# Patient Record
Sex: Female | Born: 1990 | Race: White | Hispanic: No | Marital: Married | Smoking: Never smoker
Health system: Southern US, Community
[De-identification: ages and names within clinical notes are randomized; demographics above are authoritative.]

---

## 2014-12-01 ENCOUNTER — Encounter: Payer: Self-pay | Admitting: Obstetrics & Gynecology

## 2014-12-01 ENCOUNTER — Ambulatory Visit (INDEPENDENT_AMBULATORY_CARE_PROVIDER_SITE_OTHER): Payer: 59 | Admitting: Obstetrics & Gynecology

## 2014-12-01 VITALS — BP 123/79 | HR 85 | Resp 16 | Ht 62.0 in | Wt 150.0 lb

## 2014-12-01 DIAGNOSIS — Z01419 Encounter for gynecological examination (general) (routine) without abnormal findings: Secondary | ICD-10-CM

## 2014-12-01 DIAGNOSIS — Z124 Encounter for screening for malignant neoplasm of cervix: Secondary | ICD-10-CM

## 2014-12-01 DIAGNOSIS — Z113 Encounter for screening for infections with a predominantly sexual mode of transmission: Secondary | ICD-10-CM

## 2014-12-01 DIAGNOSIS — Z3041 Encounter for surveillance of contraceptive pills: Secondary | ICD-10-CM

## 2014-12-01 DIAGNOSIS — Z0000 Encounter for general adult medical examination without abnormal findings: Secondary | ICD-10-CM

## 2014-12-01 MED ORDER — LEVONORGEST-ETH ESTRAD 91-DAY 0.15-0.03 &0.01 MG PO TABS: 1.0000 | ORAL_TABLET | Freq: Every day | ORAL | Status: DC

## 2014-12-01 NOTE — Progress Notes (Signed)
Subjective:    Amber Mack is a 24 y.o.MW G0  female who presents for an annual exam. The patient has no complaints today. She changed from her parent's insurance to her own and her generic Rosina LowensteinByaz is now too expensive. She would like a different one. Without the OCPs, she has heavy, painful periods. The patient is sexually active. GYN screening history: last pap: was normal. The patient wears seatbelts: yes. The patient participates in regular exercise: yes. Has the patient ever been transfused or tattooed?: yes. The patient reports that there is not domestic violence in her life.   Menstrual History: OB History    Gravida Para Term Preterm AB TAB SAB Ectopic Multiple Living   0 0 0 0 0 0 0 0 0 0       Menarche age: [age]  Patient's last menstrual period was 11/19/2014.    The following portions of the patient's history were reviewed and updated as appropriate: allergies, current medications, past family history, past medical history, past social history, past surgical history and problem list.  Review of Systems A comprehensive review of systems was negative.  She declines a flu vaccine. 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  Objective:    BP 123/79 mmHg  Pulse 85  Resp 16  Ht 5\' 2"  (1.575 m)  Wt 150 lb (68.04 kg)  BMI 27.43 kg/m2  LMP 11/19/2014  General Appearance:    Alert, cooperative, no distress, appears stated age  Head:    Normocephalic, without obvious abnormality, atraumatic  Eyes:    PERRL, conjunctiva/corneas clear, EOM's intact, fundi    benign, both eyes  Ears:    Normal TM's and external ear canals, both ears  Nose:   Nares normal, septum midline, mucosa normal, no drainage    or sinus tenderness  Throat:   Lips, mucosa, and tongue normal; teeth and gums normal  Neck:   Supple, symmetrical, trachea midline, no adenopathy;    thyroid:  no enlargement/tenderness/nodules; no carotid   bruit or JVD  Back:     Symmetric, no curvature, ROM normal, no CVA  tenderness  Lungs:     Clear to auscultation bilaterally, respirations unlabored  Chest Wall:    No tenderness or deformity   Heart:    Regular rate and rhythm, S1 and S2 normal, no murmur, rub   or gallop  Breast Exam:    No tenderness, masses, or nipple abnormality  Abdomen:     Soft, non-tender, bowel sounds active all four quadrants,    no masses, no organomegaly  Genitalia:    Normal female without lesion, discharge or tenderness     Extremities:   Extremities normal, atraumatic, no cyanosis or edema  Pulses:   2+ and symmetric all extremities  Skin:   Skin color, texture, turgor normal, no rashes or lesions  Lymph nodes:   Cervical, supraclavicular, and axillary nodes normal  Neurologic:   CNII-XII intact, normal strength, sensation and reflexes    throughout  .    Assessment:    Healthy female exam.    Plan:     Breast self exam technique reviewed and patient encouraged to perform self-exam monthly. Chlamydia specimen. GC specimen. Thin prep Pap smear.   Change to camrese (extended cycle, generic)

## 2014-12-05 LAB — CYTOLOGY - PAP

## 2015-01-16 ENCOUNTER — Encounter: Payer: Self-pay | Admitting: Emergency Medicine

## 2015-01-16 ENCOUNTER — Emergency Department (INDEPENDENT_AMBULATORY_CARE_PROVIDER_SITE_OTHER)
Admission: EM | Admit: 2015-01-16 | Discharge: 2015-01-16 | Disposition: A | Discharge status: Home / Self Care | Payer: 59 | Source: Home / Self Care | Attending: Family Medicine | Admitting: Family Medicine

## 2015-01-16 DIAGNOSIS — J029 Acute pharyngitis, unspecified: Secondary | ICD-10-CM

## 2015-01-16 LAB — POCT RAPID STREP A (OFFICE): Rapid Strep A Screen: NEGATIVE

## 2015-01-16 MED ORDER — LIDOCAINE VISCOUS 2 % MT SOLN: 15.0000 mL | Freq: Three times a day (TID) | OROMUCOSAL | Status: DC

## 2015-01-16 NOTE — ED Notes (Signed)
Sore throat x 5 days

## 2015-01-16 NOTE — ED Provider Notes (Signed)
CSN: [id number]     Arrival date & time 01/16/15  1513 History   First MD Initiated Contact with Patient 01/16/15 [phone]     Chief Complaint  Patient presents with  . Sore Throat      HPI Comments: Four days ago patient developed a sore throat, fatigue, myalgias, and a headache that has resolved.  The sore throat has persisted, but she has developed no other symptoms.  The history is provided by the patient.    History reviewed. No pertinent past medical history. History reviewed. No pertinent past surgical history. Family History  Problem Relation Age of Onset  . Cancer Maternal Aunt     brain  . Cancer Maternal Grandmother     great gm  stomach   History  Substance Use Topics  . Smoking status: Never Smoker   . Smokeless tobacco: Never Used  . Alcohol Use: 0.0 oz/week    0 Standard drinks or equivalent per week     Comment: receational use   OB History    Gravida Para Term Preterm AB TAB SAB Ectopic Multiple Living       Review of Systems + sore throat No cough No pleuritic pain No wheezing No nasal congestion ? post-nasal drainage No sinus pain/pressure No itchy/red eyes No earache No hemoptysis No SOB No fever/chills No nausea No vomiting No abdominal pain No diarrhea No urinary symptoms No skin rash + fatigue + myalgias + headache Used OTC meds without relief  Allergies  Review of patient's allergies indicates not on file.  Home Medications   Prior to Admission medications   Medication Sig Start Date End Date Taking? Authorizing Provider  Drospirenone-Ethinyl Estradiol-Levomefol (BEYAZ) 3-0.02-0.451 MG tablet Take 1 tablet by mouth daily.    Historical Provider, MD  Levonorgestrel-Ethinyl Estradiol (AMETHIA,CAMRESE) 0.15-0.03 &0.01 MG tablet Take 1 tablet by mouth daily. 12/01/14   Allie Bossier, MD  lidocaine (XYLOCAINE) 2 % solution Use as directed 15 mLs in the mouth or throat 4 (four) times daily -  before meals and at bedtime.  Gargle, then expectorate 01/16/15   Lattie Haw, MD  Multiple Vitamin (MULTIVITAMIN) tablet Take 1 tablet by mouth daily.    Historical Provider, MD   BP 112/75 mmHg  Pulse 81  Temp(Src) 98.3 F (36.8 C) (Oral)  Ht  (1.575 m)  Wt 154 lb (69.854 kg)  BMI 28.16 kg/m2  SpO2 100%  LMP 12/16/2014 Physical Exam Nursing notes and Vital Signs reviewed. Appearance:  Patient appears stated age, and in no acute distress Eyes:  Pupils are equal, round, and reactive to light and accomodation.  Extraocular movement is intact.  Conjunctivae are not inflamed  Ears:  Canals normal.  Tympanic membranes normal.  Nose:  Mildly congested turbinates.  No sinus tenderness.    Pharynx:  Uvula is slightly edematous, and there is mild erythema in posterior pharynx. Neck:  Supple.  The anterior nodes are tender but not enlarged. The posterior nodes are enlarged and tender bilaterally  Lungs:  Clear to auscultation.  Breath sounds are equal.  Heart:  Regular rate and rhythm without murmurs, rubs, or gallops.  Abdomen:  Nontender without masses or hepatosplenomegaly.  Bowel sounds are present.  No CVA or flank tenderness.  Extremities:  No edema.  No calf tenderness Skin:  No rash present.    ED Course  Procedures  None    Labs Reviewed  STREP A DNA PROBE  POCT RAPID STREP A (OFFICE) negative        MDM   1. Acute pharyngitis, unspecified pharyngitis type; suspect early viral URI    Throat culture pending.  Treat symptomatically for now. May take Ibuprofen 200mg , 4 tabs every 8 hours with food for sore throat.  Try warm salt water gargles.  If cold-like symptoms develop, try the following: Take plain guaifenesin (1200mg  extended release tabs such as Mucinex) twice daily, with plenty of water, for cough and congestion.  May add Pseudoephedrine (30mg , one or two every 4 to 6 hours) for sinus congestion.  Get adequate rest.   May use Afrin nasal spray (or generic oxymetazoline) twice daily for  about 5 days.  Also recommend using saline nasal spray several times daily and saline nasal irrigation (AYR is a common brand).    May take Delsym Cough Suppressant at bedtime for nighttime cough.  Stop all antihistamines for now, and other non-prescription cough/cold preparations.   Follow-up with family doctor if not improving about 7 to10 days.     Lattie HawStephen A Danen Lapaglia, MD 01/18/15 [phone]

## 2015-01-16 NOTE — Discharge Instructions (Signed)
May take Ibuprofen 200mg , 4 tabs every 8 hours with food for sore throat.  Try warm salt water gargles.  If cold-like symptoms develop, try the following: Take plain guaifenesin (1200mg  extended release tabs such as Mucinex) twice daily, with plenty of water, for cough and congestion.  May add Pseudoephedrine (30mg , one or two every 4 to 6 hours) for sinus congestion.  Get adequate rest.   May use Afrin nasal spray (or generic oxymetazoline) twice daily for about 5 days.  Also recommend using saline nasal spray several times daily and saline nasal irrigation (AYR is a common brand).    May take Delsym Cough Suppressant at bedtime for nighttime cough.  Stop all antihistamines for now, and other non-prescription cough/cold preparations.   Follow-up with family doctor if not improving about 7 to10 days.    Salt Water Gargle This solution will help make your mouth and throat feel better. HOME CARE INSTRUCTIONS   Mix 1 teaspoon of salt in 8 ounces of warm water.  Gargle with this solution as much or often as you need or as directed. Swish and gargle gently if you have any sores or wounds in your mouth.  Do not swallow this mixture. Document Released: 07/04/2004 Document Revised: 12/23/2011 Document Reviewed: 11/25/2008 [facility] Patient Information 2015 [employer] [place]. This information is not intended to replace advice given to you by your health care provider. Make sure you discuss any questions you have with your health care provider.

## 2015-01-17 LAB — STREP A DNA PROBE: GASP: NEGATIVE

## 2015-01-19 ENCOUNTER — Telehealth: Payer: Self-pay | Admitting: *Deleted

## 2015-05-10 ENCOUNTER — Telehealth: Payer: Self-pay

## 2015-05-10 NOTE — Telephone Encounter (Signed)
Patient called and would like new form of birthcontrol.   Attempted to call patient back and get answering machine message that  "voicemailbox not set up". Armandina Stammer RN BSN

## 2015-05-15 ENCOUNTER — Telehealth: Payer: Self-pay | Admitting: *Deleted

## 2015-05-15 DIAGNOSIS — Z760 Encounter for issue of repeat prescription: Secondary | ICD-10-CM

## 2015-05-15 MED ORDER — NORETHIN ACE-ETH ESTRAD-FE 1-20 MG-MCG PO TABS: 1.0000 | ORAL_TABLET | Freq: Every day | ORAL | Status: DC

## 2015-05-15 NOTE — Telephone Encounter (Signed)
Pt req to change OCP due to DUB on current pills. Sent new Rx to pt preferred pharmacy

## 2016-06-10 ENCOUNTER — Ambulatory Visit: Payer: Self-pay | Admitting: Osteopathic Medicine

## 2016-07-23 ENCOUNTER — Ambulatory Visit (INDEPENDENT_AMBULATORY_CARE_PROVIDER_SITE_OTHER): Payer: 59 | Admitting: Osteopathic Medicine

## 2016-07-23 ENCOUNTER — Encounter: Payer: Self-pay | Admitting: Osteopathic Medicine

## 2016-07-23 VITALS — BP 124/76 | HR 71 | Ht 61.0 in | Wt 140.0 lb

## 2016-07-23 DIAGNOSIS — Z3041 Encounter for surveillance of contraceptive pills: Secondary | ICD-10-CM

## 2016-07-23 DIAGNOSIS — Z0000 Encounter for general adult medical examination without abnormal findings: Secondary | ICD-10-CM

## 2016-07-23 MED ORDER — LEVONORGESTREL-ETHINYL ESTRAD 0.1-20 MG-MCG PO TABS: 1.0000 | ORAL_TABLET | Freq: Every day | ORAL | 3 refills | Status: DC

## 2016-07-23 NOTE — Progress Notes (Signed)
HPI: Amber Mack is a 25 y.o. female  who presents to [facility] today, 07/23/16,  for chief complaint of:  Chief Complaint  Patient presents with  . Establish Care    ANNUAL WELLNESS EXAM    New patient here to establish care, no complaints today. See below for review of preventive care. Patient does request alternative birth control as the one she has been taking has had coverage change through the insurance and is significantly expensive. Discussion about ideal body weight, diet/exercise plan as well.  Past medical, surgical, social and family history reviewed: History reviewed. No pertinent past medical history. History reviewed. No pertinent surgical history. Social History  Substance Use Topics  . Smoking status: Never Smoker  . Smokeless tobacco: Never Used  . Alcohol use Not on file   Family History  Problem Relation Age of Onset  . Hyperlipidemia Father   . Cancer Maternal Aunt     BRAIN     Current medication list and allergy/intolerance information reviewed:   Current Outpatient Prescriptions  Medication Sig Dispense Refill  . YASMIN 28 3-0.03 MG tablet      No current facility-administered medications for this visit.    No Known Allergies    Review of Systems:  Constitutional:  No  fever, no chills, No recent illness, No unintentional weight changes. No significant fatigue.   HEENT: No  headache, no vision change, no hearing change, No sore throat, No  sinus pressure  Cardiac: No  chest pain, No  pressure, No palpitations, No  Orthopnea  Respiratory:  No  shortness of breath. No  Cough  Gastrointestinal: No  abdominal pain, No  nausea, No  vomiting,  No  blood in stool, No  diarrhea, No  constipation   Musculoskeletal: No new myalgia/arthralgia  Genitourinary: No  incontinence, No  abnormal genital bleeding, No abnormal genital discharge  Skin: No  Rash, No other wounds/concerning lesions  Hem/Onc: No  easy  bruising/bleeding, No  abnormal lymph node  Endocrine: No cold intolerance,  No heat intolerance. No polyuria/polydipsia/polyphagia   Neurologic: No  weakness, No  dizziness, No  slurred speech/focal weakness/facial droop  Psychiatric: No  concerns with depression, No  concerns with anxiety, No sleep problems, No mood problems  Exam:  BP 124/76   Pulse 71   Ht 5\' 1"  (1.549 m)   Wt 140 lb (63.5 kg)   BMI 26.45 kg/m   Constitutional: VS see above. General Appearance: alert, well-developed, well-nourished, NAD  Eyes: Normal lids and conjunctive, non-icteric sclera  Ears, Nose, Mouth, Throat: MMM, Normal external inspection ears/nares/mouth/lips/gums. TM normal bilaterally. Pharynx/tonsils no erythema, no exudate. Nasal mucosa normal.   Neck: No masses, trachea midline. No thyroid enlargement.   Respiratory: Normal respiratory effort. no wheeze, no rhonchi, no rales  Cardiovascular: S1/S2 normal, no murmur, no rub/gallop auscultated. RRR. No lower extremity edema.   Gastrointestinal: Nontender, no masses. No hepatomegaly, no splenomegaly. No hernia appreciated. Bowel sounds normal. Rectal exam deferred.   Musculoskeletal: Gait normal. No clubbing/cyanosis of digits.   Neurological: Normal balance/coordination. No tremor.   Skin: warm, dry, intact. No rash/ulcer.   Psychiatric: Normal judgment/insight. Normal mood and affect. Oriented x3.    ASSESSMENT/PLAN:    Annual physical exam - Plan: CBC with Differential/Platelet, COMPLETE METABOLIC PANEL WITH GFR, Lipid panel  Encounter for surveillance of contraceptive pills - Plan: levonorgestrel-ethinyl estradiol (AVIANE,ALESSE,LESSINA) 0.1-20 MG-MCG tablet    FEMALE PREVENTIVE CARE  ANNUAL SCREENING/COUNSELING Tobacco - noNever  Alcohol -  social drinker Diet/Exercise - HEALTHY HABITS DISCUSSED TO DECREASE CV RISK Depression - PQH2 Negative Domestic violence concerns - no HTN SCREENING - SEE VITALS Vaccination status - SEE  BELOW  SEXUAL HEALTH Sexually active in the past year - yes With - Yes with female. STI - The patient denies history of sexually transmitted disease. STI testing today? - no  INFECTIOUS DISEASE SCREENING HIV - all adults 15-65 - does not need GC/CT - sexually active - does not need HepC - DOB [birth year] - does not need TB - does not need  DISEASE SCREENING Lipid - needs DM2 - needs Osteoporosis - does not need  CANCER SCREENING Cervical - does not need Breast - does not need Lung - does not need Colon - does not need  ADULT VACCINATION Influenza - was not indicated Td - already has HPV - was not indicated Zoster - was not indicated Pneumonia - was not indicated  OTHER Fall - exercise and Vit D age 73+ - does not need Consider ASA - age 25-59 - does not need   Visit summary with medication list and pertinent instructions was printed for patient to review. All questions at time of visit were answered - patient instructed to contact office with any additional concerns. ER/RTC precautions were reviewed with the patient. Follow-up plan: Return in about 1 year (around 07/23/2017) [facility] or sooner if needed.

## 2016-07-26 ENCOUNTER — Encounter: Payer: Self-pay | Admitting: Emergency Medicine

## 2016-12-06 DIAGNOSIS — Z01419 Encounter for gynecological examination (general) (routine) without abnormal findings: Secondary | ICD-10-CM | POA: Diagnosis not present

## 2017-02-08 DIAGNOSIS — S61501A Unspecified open wound of right wrist, initial encounter: Secondary | ICD-10-CM | POA: Diagnosis not present

## 2017-02-08 DIAGNOSIS — W5501XA Bitten by cat, initial encounter: Secondary | ICD-10-CM | POA: Diagnosis not present

## 2017-09-18 ENCOUNTER — Encounter: Payer: Self-pay | Admitting: Osteopathic Medicine

## 2017-09-18 ENCOUNTER — Ambulatory Visit (INDEPENDENT_AMBULATORY_CARE_PROVIDER_SITE_OTHER): Payer: 59 | Admitting: Osteopathic Medicine

## 2017-09-18 VITALS — BP 109/74 | HR 68 | Ht 62.0 in | Wt 152.0 lb

## 2017-09-18 DIAGNOSIS — Z0000 Encounter for general adult medical examination without abnormal findings: Secondary | ICD-10-CM | POA: Diagnosis not present

## 2017-09-18 DIAGNOSIS — Z8349 Family history of other endocrine, nutritional and metabolic diseases: Secondary | ICD-10-CM | POA: Diagnosis not present

## 2017-09-18 DIAGNOSIS — Z3041 Encounter for surveillance of contraceptive pills: Secondary | ICD-10-CM | POA: Diagnosis not present

## 2017-09-18 MED ORDER — LEVONORGESTREL-ETHINYL ESTRAD 0.1-20 MG-MCG PO TABS: 1.0000 | ORAL_TABLET | Freq: Every day | ORAL | 3 refills | Status: DC

## 2017-09-18 NOTE — Progress Notes (Signed)
HPI: Amber Mack is a 26 y.o. female  who [name], [facility],  for chief complaint of:  Chief Complaint  Patient presents with  . Annual Exam    See below for review of preventive care.    Patient does request alternative birth control as the one she has been taking has had coverage change through the insurance and is significantly expensive. Discussion about ideal body weight, diet/exercise plan as well.  Past medical, surgical, social and family history reviewed:  No past medical history on file.   No past surgical history on file.   Social History   Tobacco Use  . Smoking status: Never Smoker  . Smokeless tobacco: Never Used  Substance Use Topics  . Alcohol use: Yes    Alcohol/week: 0.0 oz    Comment: receational use   Family History  Problem Relation Age of Onset  . Hyperlipidemia Father   . Cancer Maternal Aunt        BRAIN/brain  . Cancer Maternal Grandmother        great gm  stomach  . Hypothyroidism Sister   . Thyroid nodules Sister      Current medication list and allergy/intolerance information reviewed:   Current Outpatient Medications  Medication Sig Dispense Refill  . levonorgestrel-ethinyl estradiol (AVIANE,ALESSE,LESSINA) 0.1-20 MG-MCG tablet Take 1 tablet by mouth daily. 3 Package 3  . Multiple Vitamin (MULTIVITAMIN) tablet Take 1 tablet by mouth daily.     No current facility-administered medications for this visit.    No Known Allergies    Review of Systems:  Constitutional:  No  fever, no chills, No recent illness, No unintentional weight changes. No significant fatigue.   HEENT: No  headache, no vision change, no hearing change, No sore throat, No  sinus pressure  Cardiac: No  chest pain, No  pressure, No palpitations, No  Orthopnea  Respiratory:  No  shortness of breath. No  Cough  Gastrointestinal: No  abdominal pain, No  nausea, No  vomiting,  No  blood in stool, No   diarrhea, No  constipation   Musculoskeletal: No new myalgia/arthralgia  Genitourinary: No  incontinence, No  abnormal genital bleeding, No abnormal genital discharge  Skin: No  Rash, No other wounds/concerning lesions  Hem/Onc: No  easy bruising/bleeding, No  abnormal lymph node  Endocrine: No cold intolerance,  No heat intolerance. No polyuria/polydipsia/polyphagia   Neurologic: No  weakness, No  dizziness, No  slurred speech/focal weakness/facial droop  Psychiatric: No  concerns with depression, No  concerns with anxiety, No sleep problems, No mood problems  Exam:  BP 109/74   Pulse 68   Ht 5\' 2"  (1.575 m)   Wt 152 lb (68.9 kg)   SpO2 100%   BMI 27.80 kg/m   Constitutional: VS see above. General Appearance: alert, well-developed, well-nourished, NAD  Eyes: Normal lids and conjunctive, non-icteric sclera  Ears, Nose, Mouth, Throat: MMM, Normal external inspection ears/nares/mouth/lips/gums. TM normal bilaterally. Pharynx/tonsils no erythema, no exudate. Nasal mucosa normal.   Neck: No masses, trachea midline. No thyroid enlargement.   Respiratory: Normal respiratory effort. no wheeze, no rhonchi, no rales  Cardiovascular: S1/S2 normal, no murmur, no rub/gallop auscultated. RRR. No lower extremity edema.   Gastrointestinal: Nontender, no masses. No hepatomegaly, no splenomegaly. No hernia appreciated. Bowel sounds normal. Rectal exam deferred.   Musculoskeletal: Gait normal. No clubbing/cyanosis of digits.   Neurological: Normal balance/coordination. No tremor.   Skin: warm, dry, intact. No rash/ulcer.  Psychiatric: Normal judgment/insight. Normal mood and affect. Oriented x3.    ASSESSMENT/PLAN:    Annual physical exam - Plan: COMPLETE METABOLIC PANEL WITH GFR, CBC, Lipid panel  Encounter for surveillance of contraceptive pills - Plan: levonorgestrel-ethinyl estradiol (AVIANE,ALESSE,LESSINA) 0.1-20 MG-MCG tablet  Family history of thyroid disease in sister -  Plan: TSH    FEMALE PREVENTIVE CARE  ANNUAL SCREENING/COUNSELING Tobacco - noNever  Alcohol - social drinker Diet/Exercise - HEALTHY HABITS DISCUSSED TO DECREASE CV RISK Depression - PQH2 Negative Domestic violence concerns - no HTN SCREENING - SEE VITALS Vaccination status - SEE BELOW  SEXUAL HEALTH Sexually active in the past year - yes With - Yes with female. Married, husband is [name]  STI - The patient denies history of sexually transmitted disease. STI testing needed/desired today? - no  INFECTIOUS DISEASE SCREENING HIV - all adults 15-65 - does not need GC/CT - sexually active - does not need HepC - DOB [birth year] - does not need TB - does not need  DISEASE SCREENING Lipid - needs DM2 - needs Osteoporosis - does not need  CANCER SCREENING Cervical - does not need - last done and normal 2016 Breast - does not need - no FH Lung - does not need Colon - does not need - no FH  ADULT VACCINATION Influenza - annual vaccine recommended Td - already has 2011 Zoster - was not indicated Pneumonia - was not indicated Shingles - was not indicated      Visit summary with medication list and pertinent instructions was printed for patient to review. All questions at time of visit were answered - patient instructed to contact office with any additional concerns. ER/RTC precautions were reviewed with the patient. Follow-up plan: Return in about 1 year (around 09/18/2018) for ANNUAL PHYSICAL - sooner if needed .

## 2018-01-02 DIAGNOSIS — Z01419 Encounter for gynecological examination (general) (routine) without abnormal findings: Secondary | ICD-10-CM | POA: Diagnosis not present

## 2018-09-22 ENCOUNTER — Encounter: Payer: Self-pay | Admitting: Osteopathic Medicine

## 2018-09-22 ENCOUNTER — Ambulatory Visit (INDEPENDENT_AMBULATORY_CARE_PROVIDER_SITE_OTHER): Payer: 59 | Admitting: Osteopathic Medicine

## 2018-09-22 VITALS — BP 115/65 | HR 55 | Temp 98.0°F | Wt 158.3 lb

## 2018-09-22 DIAGNOSIS — Z23 Encounter for immunization: Secondary | ICD-10-CM | POA: Diagnosis not present

## 2018-09-22 DIAGNOSIS — Z0000 Encounter for general adult medical examination without abnormal findings: Secondary | ICD-10-CM | POA: Diagnosis not present

## 2018-09-22 DIAGNOSIS — Z8349 Family history of other endocrine, nutritional and metabolic diseases: Secondary | ICD-10-CM

## 2018-09-22 MED ORDER — BEYAZ 3-0.02-0.451 MG PO TABS: 1.0000 | ORAL_TABLET | Freq: Every day | ORAL | 4 refills | Status: DC

## 2018-09-22 NOTE — Patient Instructions (Addendum)
General Preventive Care  Most recent routine screening lipids/other labs: ordered today.   Everyone should have blood pressure checked once per year. Yours looks good!    Tobacco: don't!   Alcohol: responsible moderation is ok for most adults - if you have concerns about your alcohol intake, please talk to me!   Exercise: as tolerated to reduce risk of cardiovascular disease and diabetes. Strength training will also prevent osteoporosis.   Mental health: if need for mental health care (medicines, counseling, other), or concerns about moods, please let me know!   Sexual health: if need for STD testing, or if concerns with libido/pain problems, please let me know! If you need to discuss your birth control options, please let me know!   Advanced Directive: Living Will and/or Healthcare Power of Attorney recommended for all adults, regardless of age or health.  Vaccines: please get your records to us!   Flu vaccine: recommended for almost everyone, every fall. Thanks for getting this today!   Shingles vaccine: Shingrix recommended after age 27.  Pneumonia vaccines: Prevnar and Pneumovax recommended after age 27  Tetanus booster: Tdap recommended every 10 years - will try to hunt down the urgent care records.   HPV vaccine: Gardasil recommended up to age 27 - all done! Renee RivalYay!  Cancer screenings   Colon cancer screening: recommended for everyone at age 750, sooner if needed based on family history  Breast cancer screening: mammogram recommended at age 27 every other year at least, and annually after age 27.   Cervical cancer screening: Pap every 1 to 5 years depending on age and other risk factors. Follow as directed with OBGYN.   Lung cancer screening: not needed  Infection screenings . HIV: recommended screening at least once age 27-65, more often as needed. . Gonorrhea/Chlamydia: screening as needed, though many insurances require testing for anyone on birth control  pills. . Hepatitis C: recommended for anyone born [id number] - not needed for you.  . TB: certain at-risk populations, or depending on work requirements and/or travel history Other . Bone Density Test: recommended for women at age 27

## 2018-09-22 NOTE — Progress Notes (Signed)
HPI: Amber Mack is a 27 y.o. female who  has no past medical history on file.  she presents to [facility] today, 09/22/18,  for chief complaint of: Annual check-up    Patient here for annual physical / wellness exam.  See preventive care reviewed as below.  Recent labs reviewed in detail with the patient.   Additional concerns today include:   Would like to get back on Beyaz eyac birth control once insurance changes in the new year. Has been on 5 or 6 different OCP which caused other side effects for her including mood and bleeding changes.      Past medical, surgical, social and family history reviewed:  Patient Active Problem List   Diagnosis Date Noted  . Family history of thyroid disease in sister 09/18/2017   History reviewed. No pertinent surgical history.  Social History   Tobacco Use  . Smoking status: Never Smoker  . Smokeless tobacco: Never Used  Substance Use Topics  . Alcohol use: Yes    Alcohol/week: 0.0 standard drinks    Comment: receational use    Family History  Problem Relation Age of Onset  . Hyperlipidemia Father   . Cancer Maternal Aunt        BRAIN/brain  . Cancer Maternal Grandmother        great gm  stomach  . Hypothyroidism Sister   . Thyroid nodules Sister      Current medication list and allergy/intolerance information reviewed:    Current Outpatient Medications  Medication Sig Dispense Refill  . levonorgestrel-ethinyl estradiol (AVIANE,ALESSE,LESSINA) 0.1-20 MG-MCG tablet Take 1 tablet by mouth daily. 3 Package 3  . Multiple Vitamin (MULTIVITAMIN) tablet Take 1 tablet by mouth daily.     No current facility-administered medications for this visit.     No Known Allergies   Immunization History  Administered Date(s) Administered  . Influenza,inj,Quad PF,6+ Mos 09/22/2018      Review of Systems:  Constitutional:  No  fever, no chills, No recent illness, No unintentional weight  changes. No significant fatigue.   HEENT: No  headache, no vision change, no hearing change, No sore throat, No  sinus pressure  Cardiac: No  chest pain, No  pressure, No palpitations  Respiratory:  No  shortness of breath. No  Cough  Gastrointestinal: No  abdominal pain, No  nausea, No  vomiting,  No  blood in stool, No  diarrhea, No  constipation   Musculoskeletal: No new myalgia/arthralgia  Skin: No  Rash, No other wounds/concerning lesions  Genitourinary: No  incontinence, No  abnormal genital bleeding, No abnormal genital discharge  Hem/Onc: No  easy bruising/bleeding, No  abnormal lymph node  Endocrine: No cold intolerance,  No heat intolerance. No polyuria/polydipsia/polyphagia   Neurologic: No  weakness, No  dizziness, No  slurred speech/focal weakness/facial droop  Psychiatric: No  concerns with depression, No  concerns with anxiety, No sleep problems, No mood problems  Exam:  BP 115/65 (BP Location: Left Arm, Patient Position: Sitting, Cuff Size: Normal)   Pulse (!) 55   Temp 98 F (36.7 C) (Oral)   Wt 158 lb 4.8 oz (71.8 kg)   BMI 28.95 kg/m   Constitutional: VS see above. General Appearance: alert, well-developed, well-nourished, NAD  Eyes: Normal lids and conjunctive, non-icteric sclera  Ears, Nose, Mouth, Throat: MMM, Normal external inspection ears/nares/mouth/lips/gums. TM normal bilaterally. Pharynx/tonsils no erythema, no exudate. Nasal mucosa normal.   Neck: No masses, trachea midline. No thyroid enlargement. No tenderness/mass  appreciated. No lymphadenopathy  Respiratory: Normal respiratory effort. no wheeze, no rhonchi, no rales  Cardiovascular: S1/S2 normal, no murmur, no rub/gallop auscultated. RRR. No lower extremity edema.   Gastrointestinal: Nontender, no masses. No hepatomegaly, no splenomegaly. No hernia appreciated. Bowel sounds normal. Rectal exam deferred.   Musculoskeletal: Gait normal. No clubbing/cyanosis of digits.   Neurological:  Normal balance/coordination. No tremor. No cranial nerve deficit on limited exam. Motor and sensation intact and symmetric. Cerebellar reflexes intact.   Skin: warm, dry, intact. No rash/ulcer. No concerning nevi or subq nodules on limited exam.    Psychiatric: Normal judgment/insight. Normal mood and affect. Oriented x3.          ASSESSMENT/PLAN: The primary encounter diagnosis was Annual physical exam. Diagnoses of Need for influenza vaccination and Family history of thyroid disease in sister were also pertinent to this visit.   Orders Placed This Encounter  Procedures  . Flu Vaccine QUAD 6+ mos PF IM (Fluarix Quad PF)  . CBC  . COMPLETE METABOLIC PANEL WITH GFR  . Lipid panel  . TSH    Meds ordered this encounter  Medications  . BEYAZ 3-0.02-0.451 MG tablet    Sig: Take 1 tablet by mouth daily.    Dispense:  3 Package    Refill:  4    Please forward prior authorization request if needed to Dr Mardelle Matte office fax [phone]    Patient Instructions  General Preventive Care  Most recent routine screening lipids/other labs: ordered today.   Everyone should have blood pressure checked once per year. Yours looks good!    Tobacco: don't!   Alcohol: responsible moderation is ok for most adults - if you have concerns about your alcohol intake, please talk to me!   Exercise: as tolerated to reduce risk of cardiovascular disease and diabetes. Strength training will also prevent osteoporosis.   Mental health: if need for mental health care (medicines, counseling, other), or concerns about moods, please let me know!   Sexual health: if need for STD testing, or if concerns with libido/pain problems, please let me know! If you need to discuss your birth control options, please let me know!   Advanced Directive: Living Will and/or Healthcare Power of Attorney recommended for all adults, regardless of age or health.  Vaccines: please get your records to [place]!   Flu vaccine:  recommended for almost everyone, every fall. Thanks for getting this today!   Shingles vaccine: Shingrix recommended after age 46.  Pneumonia vaccines: Prevnar and Pneumovax recommended after age 81  Tetanus booster: Tdap recommended every 10 years - will try to hunt down the urgent care records.   HPV vaccine: Gardasil recommended up to age 49 - all done! Renee Rival!  Cancer screenings   Colon cancer screening: recommended for everyone at age 43, sooner if needed based on family history  Breast cancer screening: mammogram recommended at age 60 every other year at least, and annually after age 30.   Cervical cancer screening: Pap every 1 to 5 years depending on age and other risk factors. Follow as directed with OBGYN.   Lung cancer screening: not needed  Infection screenings . HIV: recommended screening at least once age 68-65, more often as needed. . Gonorrhea/Chlamydia: screening as needed, though many insurances require testing for anyone on birth control pills. . Hepatitis C: recommended for anyone born [birth year]-1965 - not needed for you.  . TB: certain at-risk populations, or depending on work requirements and/or travel history Other . Bone Density Test: recommended  for women at age 27       Visit summary with medication list and pertinent instructions was printed for patient to review. All questions at time of visit were answered - patient instructed to contact office with any additional concerns or updates. ER/RTC precautions were reviewed with the patient.    Please note: voice recognition software was used to produce this document, and typos may escape review. Please contact Dr. Lyn HollingsheadAlexander for any needed clarifications.     Follow-up plan: Return in about 1 year (around 09/23/2019) for [employer] [facility], SOONER IF NEEDED .

## 2018-09-23 LAB — COMPLETE METABOLIC PANEL WITH GFR
AG Ratio: 1.2 (calc) (ref 1.0–2.5)
ALBUMIN MSPROF: 3.6 g/dL (ref 3.6–5.1)
ALT: 12 U/L (ref 6–29)
AST: 15 U/L (ref 10–30)
Alkaline phosphatase (APISO): 50 U/L (ref 33–115)
BUN: 14 mg/dL (ref 7–25)
CALCIUM: 9.1 mg/dL (ref 8.6–10.2)
CO2: 30 mmol/L (ref 20–32)
CREATININE: 0.78 mg/dL (ref 0.50–1.10)
Chloride: 104 mmol/L (ref 98–110)
GFR, Est African American: 121 mL/min/{1.73_m2} (ref >=60)
GFR, Est Non African American: 104 mL/min/{1.73_m2} (ref >=60)
GLOBULIN: 3 g/dL (ref 1.9–3.7)
GLUCOSE: 84 mg/dL (ref 65–99)
Potassium: 4 mmol/L (ref 3.5–5.3)
SODIUM: 140 mmol/L (ref 135–146)
Total Bilirubin: 0.4 mg/dL (ref 0.2–1.2)
Total Protein: 6.6 g/dL (ref 6.1–8.1)

## 2018-09-23 LAB — CBC
HCT: 41.6 % (ref 35.0–45.0)
HEMOGLOBIN: 14.7 g/dL (ref 11.7–15.5)
MCH: 31.7 pg (ref 27.0–33.0)
MCHC: 35.3 g/dL (ref 32.0–36.0)
MCV: 89.8 fL (ref 80.0–100.0)
MPV: 10.3 fL (ref 7.5–12.5)
Platelets: 277 10*3/uL (ref 140–400)
RBC: 4.63 10*6/uL (ref 3.80–5.10)
RDW: 12.9 % (ref 11.0–15.0)
WBC: 6.9 10*3/uL (ref 3.8–10.8)

## 2018-09-23 LAB — LIPID PANEL
CHOL/HDL RATIO: 3.1 (calc) (ref <5.0)
Cholesterol: 202 mg/dL — ABNORMAL HIGH (ref <200)
HDL: 66 mg/dL (ref >50)
LDL Cholesterol (Calc): 119 mg/dL (calc) — ABNORMAL HIGH
Non-HDL Cholesterol (Calc): 136 mg/dL (calc) — ABNORMAL HIGH (ref <130)
Triglycerides: 74 mg/dL (ref <150)

## 2018-09-23 LAB — TSH: TSH: 2.11 mIU/L

## 2019-09-23 ENCOUNTER — Encounter: Payer: Self-pay | Admitting: Osteopathic Medicine

## 2019-11-09 ENCOUNTER — Ambulatory Visit (INDEPENDENT_AMBULATORY_CARE_PROVIDER_SITE_OTHER): Payer: Managed Care, Other (non HMO) | Admitting: Osteopathic Medicine

## 2019-11-09 ENCOUNTER — Encounter: Payer: Self-pay | Admitting: Osteopathic Medicine

## 2019-11-09 ENCOUNTER — Other Ambulatory Visit: Payer: Self-pay

## 2019-11-09 VITALS — BP 98/63 | HR 73 | Temp 97.8°F | Wt 138.1 lb

## 2019-11-09 DIAGNOSIS — Z23 Encounter for immunization: Secondary | ICD-10-CM

## 2019-11-09 DIAGNOSIS — Z8349 Family history of other endocrine, nutritional and metabolic diseases: Secondary | ICD-10-CM | POA: Diagnosis not present

## 2019-11-09 DIAGNOSIS — Z0000 Encounter for general adult medical examination without abnormal findings: Secondary | ICD-10-CM | POA: Diagnosis not present

## 2019-11-09 MED ORDER — BEYAZ 3-0.02-0.451 MG PO TABS: 1.0000 | ORAL_TABLET | Freq: Every day | ORAL | 4 refills | Status: DC

## 2019-11-09 NOTE — Patient Instructions (Addendum)
General Preventive Care  Most recent routine screening lipids/other labs: ordered today.   Everyone should have blood pressure checked once per year. Yours looks good!    Tobacco: don't!   Alcohol: responsible moderation is ok for most adults - if you have concerns about your alcohol intake, please talk to me!   Exercise: as tolerated to reduce risk of cardiovascular disease and diabetes. Strength training will also prevent osteoporosis.   Mental health: if need for mental health care (medicines, counseling, other), or concerns about moods, please let me know!   Sexual health: if need for STD testing, or if concerns with libido/pain problems, please let me know! If you need to discuss your birth control options, please let me know!   Advanced Directive: Living Will and/or Healthcare Power of Attorney recommended for all adults, regardless of age or health.  Vaccines: please get your records to [place]!   Flu vaccine: recommended for almost everyone, every fall.  Shingles vaccine: Shingrix recommended after age 63.  Pneumonia vaccines: Prevnar and Pneumovax recommended after age 75  Tetanus booster: Tdap recommended every 10 years - will try to hunt down the urgent care records.   HPV vaccine: Gardasil recommended up to age 32 - all done! Renee Rival!   COVID vaccine when you are eligible! See [email or web address]  Cancer screenings   Colon cancer screening: recommended for everyone at age 3, sooner if needed based on family history  Breast cancer screening: mammogram recommended at age 61 every other year at least, and annually after age 62.   Cervical cancer screening: Pap every 1 to 5 years depending on age and other risk factors. Follow as directed with OBGYN.   Lung cancer screening: not needed  Infection screenings  HIV: recommended screening at least once age 77-65, more often as needed.  Gonorrhea/Chlamydia: screening as needed, though many insurances require testing for anyone on  birth control pills.  Hepatitis C: recommended for anyone born [birth year]-1965 - not needed for you.   TB: certain at-risk populations, or depending on work requirements and/or travel history Other  Bone Density Test: recommended for women at age 2

## 2019-11-09 NOTE — Progress Notes (Signed)
HPI: Amber Mack is a 29 y.o. female who  has no past medical history on file.  she presents to [facility] today, 11/09/19,  for chief complaint of: Annual check-up    Patient here for annual physical / wellness exam.  See preventive care reviewed as below.  Recent labs reviewed in detail with the patient.   Additional concerns today include:   Had rash in reaction to A&D ointment on tattoo (tried using the ointment on a non-tattooed area as well and same happened so we can be pretty sure it's not a reaction to the ink) healing well now   Dad (+)colon polyps age 31, no cancer      Past medical, surgical, social and family history reviewed:  Patient Active Problem List   Diagnosis Date Noted  . Family history of thyroid disease in sister 09/18/2017   History reviewed. No pertinent surgical history.  Social History   Tobacco Use  . Smoking status: Never Smoker  . Smokeless tobacco: Never Used  Substance Use Topics  . Alcohol use: Yes    Alcohol/week: 0.0 standard drinks    Comment: receational use    Family History  Problem Relation Age of Onset  . Hyperlipidemia Father   . Colon polyps Father   . Cancer Maternal Aunt        BRAIN/brain  . Cancer Maternal Grandmother        great gm  stomach  . Hypothyroidism Sister   . Thyroid nodules Sister      Current medication list and allergy/intolerance information reviewed:    Current Outpatient Medications  Medication Sig Dispense Refill  . BEYAZ 3-0.02-0.451 MG tablet Take 1 tablet by mouth daily. 3 Package 4  . Multiple Vitamin (MULTIVITAMIN) tablet Take 1 tablet by mouth daily.     No current facility-administered medications for this visit.    Allergies  Allergen Reactions  . A&D [Lanolin-Petrolatum] Rash     Immunization History  Administered Date(s) Administered  . Influenza,inj,Quad PF,6+ Mos 09/22/2018, 11/09/2019      Review of  Systems:  Constitutional:  No  fever, no chills, No recent illness, No unintentional weight changes. No significant fatigue.   HEENT: No  headache, no vision change, no hearing change, No sore throat, No  sinus pressure  Cardiac: No  chest pain, No  pressure, No palpitations  Respiratory:  No  shortness of breath. No  Cough  Gastrointestinal: No  abdominal pain, No  nausea, No  vomiting,  No  blood in stool, No  diarrhea, No  constipation   Musculoskeletal: No new myalgia/arthralgia  Skin: No  Rash, No other wounds/concerning lesions  Genitourinary: No  incontinence, No  abnormal genital bleeding, No abnormal genital discharge  Hem/Onc: No  easy bruising/bleeding, No  abnormal lymph node  Endocrine: No cold intolerance,  No heat intolerance. No polyuria/polydipsia/polyphagia   Neurologic: No  weakness, No  dizziness, No  slurred speech/focal weakness/facial droop  Psychiatric: No  concerns with depression, No  concerns with anxiety, No sleep problems, No mood problems  Exam:  BP 98/63 (BP Location: Left Arm, Patient Position: Sitting, Cuff Size: Normal)   Pulse 73   Temp 97.8 F (36.6 C) (Oral)   Wt 138 lb 1.9 oz (62.7 kg)   BMI 25.26 kg/m   Constitutional: VS see above. General Appearance: alert, well-developed, well-nourished, NAD  Eyes: Normal lids and conjunctive, non-icteric sclera  Neck: No masses, trachea midline. No thyroid enlargement. No tenderness/mass appreciated.  No lymphadenopathy  Respiratory: Normal respiratory effort. no wheeze, no rhonchi, no rales  Cardiovascular: S1/S2 normal, no murmur, no rub/gallop auscultated. RRR. No lower extremity edema.   Gastrointestinal: Nontender, no masses. No hepatomegaly, no splenomegaly. No hernia appreciated. Bowel sounds normal. Rectal exam deferred.   Musculoskeletal: Gait normal. No clubbing/cyanosis of digits.   Neurological: Normal balance/coordination. No tremor. No cranial nerve deficit on limited exam. Motor  and sensation intact and symmetric. Cerebellar reflexes intact.   Skin: warm, dry, intact. No rash/ulcer. No concerning nevi or subq nodules on limited exam.    Psychiatric: Normal judgment/insight. Normal mood and affect. Oriented x3.          ASSESSMENT/PLAN: The primary encounter diagnosis was Annual physical exam. Diagnoses of Need for influenza vaccination and Family history of thyroid disease in sister were also pertinent to this visit.   Orders Placed This Encounter  Procedures  . Flu Vaccine QUAD 6+ mos PF IM (Fluarix Quad PF)  . CBC  . COMPLETE METABOLIC PANEL WITH GFR  . Lipid panel  . TSH    Meds ordered this encounter  Medications  . DISCONTD: BEYAZ 3-0.02-0.451 MG tablet    Sig: Take 1 tablet by mouth daily.    Dispense:  3 Package    Refill:  4    Please forward prior authorization request if needed to Dr Mardelle Matte office fax [phone]  . BEYAZ 3-0.02-0.451 MG tablet    Sig: Take 1 tablet by mouth daily.    Dispense:  3 Package    Refill:  4    Please forward prior authorization request if needed to Dr Mardelle Matte office fax [phone]    Patient Instructions  General Preventive Care  Most recent routine screening lipids/other labs: ordered today.   Everyone should have blood pressure checked once per year. Yours looks good!    Tobacco: don't!   Alcohol: responsible moderation is ok for most adults - if you have concerns about your alcohol intake, please talk to me!   Exercise: as tolerated to reduce risk of cardiovascular disease and diabetes. Strength training will also prevent osteoporosis.   Mental health: if need for mental health care (medicines, counseling, other), or concerns about moods, please let me know!   Sexual health: if need for STD testing, or if concerns with libido/pain problems, please let me know! If you need to discuss your birth control options, please let me know!   Advanced Directive: Living Will and/or Healthcare  Power of Attorney recommended for all adults, regardless of age or health.  Vaccines: please get your records to [place]!   Flu vaccine: recommended for almost everyone, every fall.  Shingles vaccine: Shingrix recommended after age 80.  Pneumonia vaccines: Prevnar and Pneumovax recommended after age 48  Tetanus booster: Tdap recommended every 10 years - will try to hunt down the urgent care records.   HPV vaccine: Gardasil recommended up to age 60 - all done! Renee Rival!   COVID vaccine when you are eligible! See [email or web address]  Cancer screenings   Colon cancer screening: recommended for everyone at age 97, sooner if needed based on family history  Breast cancer screening: mammogram recommended at age 86 every other year at least, and annually after age 67.   Cervical cancer screening: Pap every 1 to 5 years depending on age and other risk factors. Follow as directed with OBGYN.   Lung cancer screening: not needed  Infection screenings  HIV: recommended screening at least once age 78-65, more often as needed.  Gonorrhea/Chlamydia: screening as needed, though many insurances require testing for anyone on birth control pills.  Hepatitis C: recommended for anyone born 15-1965 - not needed for you.   TB: certain at-risk populations, or depending on work requirements and/or travel history Other  Bone Density Test: recommended for women at age 73    Wt Readings from Last 3 Encounters:  11/09/19 138 lb 1.9 oz (62.7 kg)  09/22/18 158 lb 4.8 oz (71.8 kg)  09/18/17 152 lb (68.9 kg)       Visit summary with medication list and pertinent instructions was printed for patient to review. All questions at time of visit were answered - patient instructed to contact office with any additional concerns or updates. ER/RTC precautions were reviewed with the patient.    Please note: voice recognition software was used to produce this document, and typos may escape review. Please contact Dr.  Lyn Hollingshead for any needed clarifications.     Follow-up plan: Return in about 1 year (around 11/08/2020) for [place] (call week prior to visit for lab orders).

## 2019-11-12 ENCOUNTER — Telehealth: Payer: Self-pay | Admitting: Osteopathic Medicine

## 2019-11-12 NOTE — Telephone Encounter (Signed)
I received a message that Marygrace Drought is not covered by this patients plan. I am copying a list of preferred medications. Please advise.    ethinyl estradiol-drospirenone, ethinyl estradiol-drospirenone-levomefolate, ethinyl estradiolnorethindrone acetate-iron

## 2019-11-15 ENCOUNTER — Telehealth: Payer: Self-pay | Admitting: Osteopathic Medicine

## 2019-11-15 NOTE — Telephone Encounter (Signed)
Received fax from [place] and Marygrace Drought is approved from 11/15/2019 - 11/14/2020

## 2019-11-15 NOTE — Telephone Encounter (Signed)
Received fax for prior authorization on Beyaz sent through cover my meds waiting on determination. - CF

## 2019-11-17 MED ORDER — DROSPIREN-ETH ESTRAD-LEVOMEFOL 3-0.02-0.451 MG PO TABS: 1.0000 | ORAL_TABLET | Freq: Every day | ORAL | 4 refills | Status: DC

## 2020-01-07 ENCOUNTER — Ambulatory Visit: Payer: 59 | Attending: Internal Medicine

## 2020-01-07 DIAGNOSIS — Z23 Encounter for immunization: Secondary | ICD-10-CM

## 2020-01-07 NOTE — Progress Notes (Signed)
   Covid-19 Vaccination Clinic  Name:  Giulliana Mcroberts    MRN: [id number] DOB: Aug 29, 1991  01/07/2020  Ms. Hamler was observed post Covid-19 immunization for 15 minutes without incident. She was provided with Vaccine Information Sheet and instruction to access the V-Safe system.   Ms. Ackerley was instructed to call 911 with any severe reactions post vaccine: Marland Kitchen Difficulty breathing  . Swelling of face and throat  . A fast heartbeat  . A bad rash all over body  . Dizziness and weakness   Immunizations Administered    Name Date Dose VIS Date Route   Pfizer COVID-19 Vaccine 01/07/2020 12:35 PM 0.3 mL 09/24/2019 Intramuscular   Manufacturer: [employer], [employer]   Lot: [id number]   NDC: [address]-1

## 2020-02-01 ENCOUNTER — Ambulatory Visit: Payer: 59 | Attending: Internal Medicine

## 2020-02-01 DIAGNOSIS — Z23 Encounter for immunization: Secondary | ICD-10-CM

## 2020-02-01 NOTE — Progress Notes (Signed)
   Covid-19 Vaccination Clinic  Name:  Amber Mack    MRN: [id number] DOB: 1991-07-04  02/01/2020  [name] was observed post Covid-19 immunization for 15 minutes without incident. She was provided with Vaccine Information Sheet and instruction to access the V-Safe system.   [name] was instructed to call 911 with any severe reactions post vaccine: Marland Kitchen Difficulty breathing  . Swelling of face and throat  . A fast heartbeat  . A bad rash all over body  . Dizziness and weakness   Immunizations Administered    Name Date Dose VIS Date Route   Pfizer COVID-19 Vaccine 02/01/2020  8:59 AM 0.3 mL 12/08/2018 Intramuscular   Manufacturer: [employer], [employer]   Lot: [id number]   NDC: [address]-5

## 2020-05-02 ENCOUNTER — Encounter: Payer: Self-pay | Admitting: Osteopathic Medicine

## 2020-05-02 NOTE — Telephone Encounter (Signed)
Both forms placed in provider's box.

## 2020-11-09 ENCOUNTER — Other Ambulatory Visit: Payer: Self-pay

## 2020-11-09 ENCOUNTER — Encounter: Payer: Self-pay | Admitting: Osteopathic Medicine

## 2020-11-09 ENCOUNTER — Ambulatory Visit (INDEPENDENT_AMBULATORY_CARE_PROVIDER_SITE_OTHER): Payer: BC Managed Care – PPO | Admitting: Osteopathic Medicine

## 2020-11-09 VITALS — BP 105/68 | HR 57 | Temp 97.4°F | Wt 153.1 lb

## 2020-11-09 DIAGNOSIS — Z13220 Encounter for screening for lipoid disorders: Secondary | ICD-10-CM | POA: Diagnosis not present

## 2020-11-09 DIAGNOSIS — Z23 Encounter for immunization: Secondary | ICD-10-CM

## 2020-11-09 DIAGNOSIS — Z0000 Encounter for general adult medical examination without abnormal findings: Secondary | ICD-10-CM | POA: Diagnosis not present

## 2020-11-09 DIAGNOSIS — Z131 Encounter for screening for diabetes mellitus: Secondary | ICD-10-CM | POA: Diagnosis not present

## 2020-11-09 DIAGNOSIS — Z8349 Family history of other endocrine, nutritional and metabolic diseases: Secondary | ICD-10-CM | POA: Diagnosis not present

## 2020-11-09 MED ORDER — DROSPIREN-ETH ESTRAD-LEVOMEFOL 3-0.02-0.451 MG PO TABS: 1.0000 | ORAL_TABLET | Freq: Every day | ORAL | 4 refills | Status: DC

## 2020-11-09 NOTE — Progress Notes (Signed)
HPI: Amber Mack is a 30 y.o. female who  has no past medical history on file.  she presents to [facility] today, 11/09/20,  for chief complaint of:   Patient here for annual physical / wellness exam.   See preventive care reviewed as below.      Past medical, surgical, social and family history reviewed:  Patient Active Problem List   Diagnosis Date Noted  . Family history of thyroid disease in sister 09/18/2017   No past surgical history on file.  Social History   Tobacco Use  . Smoking status: Never Smoker  . Smokeless tobacco: Never Used  Substance Use Topics  . Alcohol use: Yes    Alcohol/week: 0.0 standard drinks    Comment: receational use    Family History  Problem Relation Age of Onset  . Hyperlipidemia Father   . Colon polyps Father   . Cancer Maternal Aunt        BRAIN/brain  . Cancer Maternal Grandmother        great gm  stomach  . Hypothyroidism Sister   . Thyroid nodules Sister      Current medication list and allergy/intolerance information reviewed:    Current Outpatient Medications  Medication Sig Dispense Refill  . Multiple Vitamin (MULTIVITAMIN) tablet Take 1 tablet by mouth daily.    . Drospirenone-Ethinyl Estradiol-Levomefol (BEYAZ) 3-0.02-0.451 MG tablet Take 1 tablet by mouth daily. 84 tablet 4   No current facility-administered medications for this visit.    Allergies  Allergen Reactions  . A&D [Lanolin-Petrolatum] Rash     Immunization History  Administered Date(s) Administered  . Influenza,inj,Quad PF,6+ Mos 09/22/2018, 11/09/2019, 11/09/2020  . PFIZER(Purple Top)SARS-COV-2 Vaccination 01/07/2020, 02/01/2020, 09/16/2020  . Tdap 01/12/2018        Exam:  BP 105/68 (BP Location: Left Arm, Patient Position: Sitting, Cuff Size: Normal)   Pulse (!) 57   Temp (!) 97.4 F (36.3 C) (Oral)   Wt 153 lb 1.9 oz (69.5 kg)   BMI 28.01 kg/m   Constitutional: VS see above. General  Appearance: alert, well-developed, well-nourished, NAD  Eyes: Normal lids and conjunctive, non-icteric sclera  Neck: No masses, trachea midline. No thyroid enlargement. No tenderness/mass appreciated. No lymphadenopathy  Respiratory: Normal respiratory effort. no wheeze, no rhonchi, no rales  Cardiovascular: S1/S2 normal, no murmur, no rub/gallop auscultated. RRR. No lower extremity edema.   Gastrointestinal: Nontender, no masses. No hepatomegaly, no splenomegaly. No hernia appreciated. Bowel sounds normal. Rectal exam deferred.   Musculoskeletal: Gait normal. No clubbing/cyanosis of digits.   Neurological: Normal balance/coordination. No tremor. No cranial nerve deficit on limited exam. Motor and sensation intact and symmetric. Cerebellar reflexes intact.   Skin: warm, dry, intact. No rash/ulcer. No concerning nevi or subq nodules on limited exam.    Psychiatric: Normal judgment/insight. Normal mood and affect. Oriented x3.          ASSESSMENT/PLAN: The primary encounter diagnosis was Annual physical exam. Diagnoses of Family history of thyroid disease in sister, Screening, lipid, Screening for diabetes mellitus, and Need for influenza vaccination were also pertinent to this visit.   Orders Placed This Encounter  Procedures  . Flu Vaccine QUAD 6+ mos PF IM (Fluarix Quad PF)  . CBC  . COMPLETE METABOLIC PANEL WITH GFR  . Lipid panel  . TSH    Meds ordered this encounter  Medications  . Drospirenone-Ethinyl Estradiol-Levomefol (BEYAZ) 3-0.02-0.451 MG tablet    Sig: Take 1 tablet by mouth daily.  Dispense:  84 tablet    Refill:  4    Patient Instructions  General Preventive Care  Most recent routine screening labs: ordered today.   Blood pressure goal 130/80 or less.   Tobacco: don't!   Alcohol: responsible moderation is ok for most adults - if you have concerns about your alcohol intake, please talk to me!   Exercise: as tolerated to reduce risk of  cardiovascular disease and diabetes. Strength training will also prevent osteoporosis.   Mental health: if need for mental health care (medicines, counseling, other), or concerns about moods, please let me know!   Sexual / Reproductive health: if need for STD testing, or if concerns with libido/pain problems, please let me know! If you need to discuss family planning, please let me know!   Advanced Directive: Living Will and/or Healthcare Power of Attorney recommended for all adults, regardless of age or health.  Vaccines  Flu vaccine: every fall/winter  Tetanus booster: every 10 years (due 2029) / 3rd trimester of pregnancy  COVID vaccine: THANKS for getting your vaccine! :)  Cancer screenings   Colon cancer screening: for everyone age 50-75.  Breast cancer screening: mammogram at age 29  Cervical cancer screening: Pap due 04/2023  Lung cancer screening: not needed for non-smokers  Infection screenings  . HIV: recommended screening at least once age 54-65, more often as needed. . Gonorrhea/Chlamydia: screening as needed . Hepatitis C: recommended once for everyone age 63-75 . TB: certain at-risk populations, or depending on work requirements and/or travel history   Wt Readings from Last 3 Encounters:  11/09/20 153 lb 1.9 oz (69.5 kg)  11/09/19 138 lb 1.9 oz (62.7 kg)  09/22/18 158 lb 4.8 oz (71.8 kg)       Visit summary with medication list and pertinent instructions was printed for patient to review. All questions at time of visit were answered - patient instructed to contact office with any additional concerns or updates. ER/RTC precautions were reviewed with the patient.    Please note: voice recognition software was used to produce this document, and typos may escape review. Please contact Dr. Lyn Hollingshead for any needed clarifications.     Follow-up plan: Return in about 1 year (around 11/09/2021) for ANNUAL CHECK-UP - SEE [place] SOONER IF NEEDED.

## 2020-11-09 NOTE — Patient Instructions (Addendum)
General Preventive Care  Most recent routine screening labs: ordered today.   Blood pressure goal 130/80 or less.   Tobacco: don't!   Alcohol: responsible moderation is ok for most adults - if you have concerns about your alcohol intake, please talk to me!   Exercise: as tolerated to reduce risk of cardiovascular disease and diabetes. Strength training will also prevent osteoporosis.   Mental health: if need for mental health care (medicines, counseling, other), or concerns about moods, please let me know!   Sexual / Reproductive health: if need for STD testing, or if concerns with libido/pain problems, please let me know! If you need to discuss family planning, please let me know!   Advanced Directive: Living Will and/or Healthcare Power of Attorney recommended for all adults, regardless of age or health.  Vaccines  Flu vaccine: every fall/winter  Tetanus booster: every 10 years (due 2029) / 3rd trimester of pregnancy  COVID vaccine: THANKS for getting your vaccine! :)  Cancer screenings   Colon cancer screening: for everyone age 20-75.  Breast cancer screening: mammogram at age 54  Cervical cancer screening: Pap due 04/2023  Lung cancer screening: not needed for non-smokers  Infection screenings  . HIV: recommended screening at least once age 30-65, more often as needed. . Gonorrhea/Chlamydia: screening as needed . Hepatitis C: recommended once for everyone age 2-75 . TB: certain at-risk populations, or depending on work requirements and/or travel history

## 2020-11-15 ENCOUNTER — Other Ambulatory Visit: Payer: Self-pay | Admitting: Osteopathic Medicine

## 2020-11-21 ENCOUNTER — Encounter: Payer: Self-pay | Admitting: Osteopathic Medicine

## 2020-11-21 MED ORDER — BEYAZ 3-0.02-0.451 MG PO TABS: 1.0000 | ORAL_TABLET | Freq: Every day | ORAL | 4 refills | Status: DC

## 2021-04-19 DIAGNOSIS — R0981 Nasal congestion: Secondary | ICD-10-CM | POA: Diagnosis not present

## 2021-04-19 DIAGNOSIS — J343 Hypertrophy of nasal turbinates: Secondary | ICD-10-CM | POA: Diagnosis not present

## 2021-05-11 DIAGNOSIS — Z01419 Encounter for gynecological examination (general) (routine) without abnormal findings: Secondary | ICD-10-CM | POA: Diagnosis not present

## 2021-05-11 DIAGNOSIS — Z6829 Body mass index (BMI) 29.0-29.9, adult: Secondary | ICD-10-CM | POA: Diagnosis not present

## 2021-06-15 DIAGNOSIS — Z30430 Encounter for insertion of intrauterine contraceptive device: Secondary | ICD-10-CM | POA: Diagnosis not present

## 2021-06-15 DIAGNOSIS — Z3202 Encounter for pregnancy test, result negative: Secondary | ICD-10-CM | POA: Diagnosis not present

## 2021-07-27 DIAGNOSIS — Z30431 Encounter for routine checking of intrauterine contraceptive device: Secondary | ICD-10-CM | POA: Diagnosis not present

## 2021-11-09 ENCOUNTER — Encounter: Payer: Self-pay | Admitting: Medical-Surgical

## 2021-11-09 ENCOUNTER — Other Ambulatory Visit: Payer: Self-pay

## 2021-11-09 ENCOUNTER — Ambulatory Visit (INDEPENDENT_AMBULATORY_CARE_PROVIDER_SITE_OTHER): Payer: BC Managed Care – PPO | Admitting: Medical-Surgical

## 2021-11-09 ENCOUNTER — Encounter: Payer: BC Managed Care – PPO | Admitting: Osteopathic Medicine

## 2021-11-09 ENCOUNTER — Telehealth: Payer: Self-pay | Admitting: Medical-Surgical

## 2021-11-09 VITALS — BP 97/64 | HR 56 | Resp 20 | Ht 62.0 in | Wt 163.6 lb

## 2021-11-09 DIAGNOSIS — Z0000 Encounter for general adult medical examination without abnormal findings: Secondary | ICD-10-CM

## 2021-11-09 DIAGNOSIS — Z7689 Persons encountering health services in other specified circumstances: Secondary | ICD-10-CM | POA: Diagnosis not present

## 2021-11-09 DIAGNOSIS — Z1329 Encounter for screening for other suspected endocrine disorder: Secondary | ICD-10-CM | POA: Diagnosis not present

## 2021-11-09 DIAGNOSIS — Z8349 Family history of other endocrine, nutritional and metabolic diseases: Secondary | ICD-10-CM | POA: Diagnosis not present

## 2021-11-09 DIAGNOSIS — Z833 Family history of diabetes mellitus: Secondary | ICD-10-CM

## 2021-11-09 DIAGNOSIS — Z131 Encounter for screening for diabetes mellitus: Secondary | ICD-10-CM

## 2021-11-09 DIAGNOSIS — Z23 Encounter for immunization: Secondary | ICD-10-CM | POA: Diagnosis not present

## 2021-11-09 NOTE — Progress Notes (Signed)
HPI: Amber Mack is a 31 y.o. female who  has no past medical history on file.  she presents to [facility] today, 11/09/21,  for chief complaint of: Annual physical exam  Dentist: Every 6 months, UTD Eye exam: Last in September, wears contacts Exercise: Burn [employer] 3 times weekly, elliptical at home Diet: No restrictions, calorie counts, well-balanced Pap smear: Up-to-date COVID vaccine: Up-to-date  Concerns: Had a right foot injury in 2020.  When she is running or sprinting she has intermittent pain.  Stretching does help.  Pain is on the top of the foot.  Wearing supportive sneakers for exercises.  Past medical, surgical, social and family history reviewed:  Patient Active Problem List   Diagnosis Date Noted   Family history of thyroid disease in sister 09/18/2017    History reviewed. No pertinent surgical history.  Social History   Tobacco Use   Smoking status: Never   Smokeless tobacco: Never  Substance Use Topics   Alcohol use: Yes    Comment: receational use    Family History  Problem Relation Age of Onset   Hyperlipidemia Father    Colon polyps Father    Diabetes Father    Cancer Maternal Aunt        BRAIN/brain   Cancer Maternal Grandmother        great gm  stomach   Hearing loss Maternal Grandmother    Hypothyroidism Sister    Thyroid nodules Sister    Anxiety disorder Sister    Depression Sister    Diabetes Sister    Obesity Sister      Current medication list and allergy/intolerance information reviewed:    Current Outpatient Medications  Medication Sig Dispense Refill   levonorgestrel (KYLEENA) 19.5 MG IUD 1 each by Intrauterine route once.     Multiple Vitamin (MULTIVITAMIN) tablet Take 1 tablet by mouth daily.     No current facility-administered medications for this visit.    Allergies  Allergen Reactions   A&D [Lanolin-Petrolatum] Rash      Review of Systems: Constitutional:  No  fever,  no chills, No recent illness, No unintentional weight changes. No significant fatigue.  HEENT: No  headache, no vision change, no hearing change, No sore throat, No  sinus pressure Cardiac: No  chest pain, No  pressure, No palpitations, No  Orthopnea Respiratory:  No  shortness of breath. No  Cough Gastrointestinal: No  abdominal pain, No  nausea, No  vomiting,  No  blood in stool, No  diarrhea, No  constipation  Musculoskeletal: No new myalgia/arthralgia Skin: No  Rash, No other wounds/concerning lesions Genitourinary: No  incontinence, No  abnormal genital bleeding, No abnormal genital discharge Hem/Onc: No  easy bruising/bleeding, No  abnormal lymph node Endocrine: No cold intolerance,  No heat intolerance. No polyuria/polydipsia/polyphagia  Neurologic: No  weakness, No  dizziness, No  slurred speech/focal weakness/facial droop Psychiatric: No  concerns with depression, No  concerns with anxiety, No sleep problems, No mood problems  Exam:  BP 97/64 (BP Location: Left Arm, Patient Position: Sitting, Cuff Size: Large)    Pulse (!) 56    Resp 20    Ht _0  (1.575 m)    Wt 163 lb 9.6 oz (74.2 kg)    SpO2 99%    BMI 29.92 kg/m  Constitutional: VS see above. General Appearance: alert, well-developed, well-nourished, NAD Eyes: Normal lids and conjunctive, non-icteric sclera Ears, Nose, Mouth, Throat: MMM, Normal external inspection ears/nares/mouth/lips/gums. TM normal bilaterally.  Neck: No masses, trachea midline. No thyroid enlargement. No tenderness/mass appreciated. No lymphadenopathy Respiratory: Normal respiratory effort. no wheeze, no rhonchi, no rales Cardiovascular: S1/S2 normal, no murmur, no rub/gallop auscultated. RRR. No lower extremity edema. Pedal pulse II/IV bilaterally PT. No carotid bruit or JVD. No abdominal aortic bruit. Gastrointestinal: Nontender, no masses. No hepatomegaly, no splenomegaly. No hernia appreciated. Bowel sounds normal. Rectal exam deferred.  Musculoskeletal:  Gait normal. No clubbing/cyanosis of digits.  Neurological: Normal balance/coordination. No tremor. No cranial nerve deficit on limited exam. Motor and sensation intact and symmetric. Cerebellar reflexes intact.  Skin: warm, dry, intact. No rash/ulcer. No concerning nevi or subq nodules on limited exam.   Psychiatric: Normal judgment/insight. Normal mood and affect. Oriented x3.    ASSESSMENT/PLAN:   1. Encounter to establish care Reviewed available information and discussed care concerns with patient.   2. Flu vaccine need Flu vaccine given in office today. - Flu Vaccine QUAD 67moIM (Fluarix, Fluzone & Alfiuria Quad PF)  3. Thyroid disorder screen 4. Family history of thyroid disease Checking TSH. - TSH  5. Diabetes mellitus screening 6. Family history of diabetes mellitus Checking hemoglobin A1c. - Hemoglobin A1c  7. Annual physical exam Checking labs as below.  Wellness information provided with AVS.  Up-to-date on preventative care. - Lipid panel - COMPLETE METABOLIC PANEL WITH GFR - CBC with Differential/Platelet   Orders Placed This Encounter  Procedures   Flu Vaccine QUAD 634moM (Fluarix, Fluzone & Alfiuria Quad PF)   TSH   Lipid panel   COMPLETE METABOLIC PANEL WITH GFR   CBC with Differential/Platelet   Hemoglobin A1c    No orders of the defined types were placed in this encounter.   Patient Instructions  Preventive Care [id number] Old, Female Preventive care refers to lifestyle choices and visits with your health care provider that can promote health and wellness. Preventive care visits are also called wellness exams. What can I expect for my preventive care visit? Counseling During your preventive care visit, your health care provider may ask about your: Medical history, including: Past medical problems. Family medical history. Pregnancy history. Current health, including: Menstrual cycle. Method of birth control. Emotional well-being. Home life  and relationship well-being. Sexual activity and sexual health. Lifestyle, including: Alcohol, nicotine or tobacco, and drug use. Access to firearms. Diet, exercise, and sleep habits. Work and work enStatisticianSunscreen use. Safety issues such as seatbelt and bike helmet use. Physical exam Your health care provider may check your: Height and weight. These may be used to calculate your BMI (body mass index). BMI is a measurement that tells if you are at a healthy weight. Waist circumference. This measures the distance around your waistline. This measurement also tells if you are at a healthy weight and may help predict your risk of certain diseases, such as type 2 diabetes and high blood pressure. Heart rate and blood pressure. Body temperature. Skin for abnormal spots. What immunizations do I need? Vaccines are usually given at various ages, according to a schedule. Your health care provider will recommend vaccines for you based on your age, medical history, and lifestyle or other factors, such as travel or where you work. What tests do I need? Screening Your health care provider may recommend screening tests for certain conditions. This may include: Pelvic exam and Pap test. Lipid and cholesterol levels. Diabetes screening. This is done by checking your blood sugar (glucose) after you have not eaten for a while (fasting). Hepatitis B test. Hepatitis C test. HIV (  human immunodeficiency virus) test. STI (sexually transmitted infection) testing, if you are at risk. BRCA-related cancer screening. This may be done if you have a family history of breast, ovarian, tubal, or peritoneal cancers. Talk with your health care provider about your test results, treatment options, and if necessary, the need for more tests. Follow these instructions at home: Eating and drinking  Eat a healthy diet that includes fresh fruits and vegetables, whole grains, lean protein, and low-fat dairy  products. Take vitamin and mineral supplements as recommended by your health care provider. Do not drink alcohol if: Your health care provider tells you not to drink. You are pregnant, may be pregnant, or are planning to become pregnant. If you drink alcohol: Limit how much you have to 0-1 drink a day. Know how much alcohol is in your drink. In the [place] one drink equals one 12 oz bottle of beer (355 mL), one 5 oz glass of wine (148 mL), or one 1 oz glass of hard liquor (44 mL). Lifestyle Brush your teeth every morning and night with fluoride toothpaste. Floss one time each day. Exercise for at least 30 minutes 5 or more days each week. Do not use any products that contain nicotine or tobacco. These products include cigarettes, chewing tobacco, and vaping devices, such as e-cigarettes. If you need help quitting, ask your health care provider. Do not use drugs. If you are sexually active, practice safe sex. Use a condom or other form of protection to prevent STIs. If you do not wish to become pregnant, use a form of birth control. If you plan to become pregnant, see your health care provider for a prepregnancy visit. Find healthy ways to manage stress, such as: Meditation, yoga, or listening to music. Journaling. Talking to a trusted person. Spending time with friends and family. Minimize exposure to UV radiation to reduce your risk of skin cancer. Safety Always wear your seat belt while driving or riding in a vehicle. Do not drive: If you have been drinking alcohol. Do not ride with someone who has been drinking. If you have been using any mind-altering substances or drugs. While texting. When you are tired or distracted. Wear a helmet and other protective equipment during sports activities. If you have firearms in your house, make sure you follow all gun safety procedures. Seek help if you have been physically or sexually abused. What's next? Go to your health care provider once a  year for an annual wellness visit. Ask your health care provider how often you should have your eyes and teeth checked. Stay up to date on all vaccines. This information is not intended to replace advice given to you by your health care provider. Make sure you discuss any questions you have with your health care provider. Document Revised: 03/28/2021 Document Reviewed: 03/28/2021 Elsevier Patient Education  [place].   Follow-up plan: Return in about 1 year (around 11/09/2022) for annual physical exam or sooner if needed.  Clearnce Sorrel, DNP, APRN, FNP-BC [facility]

## 2021-11-09 NOTE — Patient Instructions (Signed)
Preventive Care 21-31 Years Old, Female °Preventive care refers to lifestyle choices and visits with your health care provider that can promote health and wellness. Preventive care visits are also called wellness exams. °What can I expect for my preventive care visit? °Counseling °During your preventive care visit, your health care provider may ask about your: °Medical history, including: °Past medical problems. °Family medical history. °Pregnancy history. °Current health, including: °Menstrual cycle. °Method of birth control. °Emotional well-being. °Home life and relationship well-being. °Sexual activity and sexual health. °Lifestyle, including: °Alcohol, nicotine or tobacco, and drug use. °Access to firearms. °Diet, exercise, and sleep habits. °Work and work environment. °Sunscreen use. °Safety issues such as seatbelt and bike helmet use. °Physical exam °Your health care provider may check your: °Height and weight. These may be used to calculate your BMI (body mass index). BMI is a measurement that tells if you are at a healthy weight. °Waist circumference. This measures the distance around your waistline. This measurement also tells if you are at a healthy weight and may help predict your risk of certain diseases, such as type 2 diabetes and high blood pressure. °Heart rate and blood pressure. °Body temperature. °Skin for abnormal spots. °What immunizations do I need? °Vaccines are usually given at various ages, according to a schedule. Your health care provider will recommend vaccines for you based on your age, medical history, and lifestyle or other factors, such as travel or where you work. °What tests do I need? °Screening °Your health care provider may recommend screening tests for certain conditions. This may include: °Pelvic exam and Pap test. °Lipid and cholesterol levels. °Diabetes screening. This is done by checking your blood sugar (glucose) after you have not eaten for a while (fasting). °Hepatitis B  test. °Hepatitis C test. °HIV (human immunodeficiency virus) test. °STI (sexually transmitted infection) testing, if you are at risk. °BRCA-related cancer screening. This may be done if you have a family history of breast, ovarian, tubal, or peritoneal cancers. °Talk with your health care provider about your test results, treatment options, and if necessary, the need for more tests. °Follow these instructions at home: °Eating and drinking ° °Eat a healthy diet that includes fresh fruits and vegetables, whole grains, lean protein, and low-fat dairy products. °Take vitamin and mineral supplements as recommended by your health care provider. °Do not drink alcohol if: °Your health care provider tells you not to drink. °You are pregnant, may be pregnant, or are planning to become pregnant. °If you drink alcohol: °Limit how much you have to 0-1 drink a day. °Know how much alcohol is in your drink. In the [place] one drink equals one 12 oz bottle of beer (355 mL), one 5 oz glass of wine (148 mL), or one 1½ oz glass of hard liquor (44 mL). °Lifestyle °Brush your teeth every morning and night with fluoride toothpaste. Floss one time each day. °Exercise for at least 30 minutes 5 or more days each week. °Do not use any products that contain nicotine or tobacco. These products include cigarettes, chewing tobacco, and vaping devices, such as e-cigarettes. If you need help quitting, ask your health care provider. °Do not use drugs. °If you are sexually active, practice safe sex. Use a condom or other form of protection to prevent STIs. °If you do not wish to become pregnant, use a form of birth control. If you plan to become pregnant, see your health care provider for a prepregnancy visit. °Find healthy ways to manage stress, such as: °Meditation, yoga,   or listening to music. °Journaling. °Talking to a trusted person. °Spending time with friends and family. °Minimize exposure to UV radiation to reduce your risk of skin  cancer. °Safety °Always wear your seat belt while driving or riding in a vehicle. °Do not drive: °If you have been drinking alcohol. Do not ride with someone who has been drinking. °If you have been using any mind-altering substances or drugs. °While texting. °When you are tired or distracted. °Wear a helmet and other protective equipment during sports activities. °If you have firearms in your house, make sure you follow all gun safety procedures. °Seek help if you have been physically or sexually abused. °What's next? °Go to your health care provider once a year for an annual wellness visit. °Ask your health care provider how often you should have your eyes and teeth checked. °Stay up to date on all vaccines. °This information is not intended to replace advice given to you by your health care provider. Make sure you discuss any questions you have with your health care provider. °Document Revised: 03/28/2021 Document Reviewed: 03/28/2021 °Elsevier Patient Education © 2022 Elsevier Inc. ° °

## 2021-11-09 NOTE — Telephone Encounter (Signed)
Please verify that this patient's follow-up appointment in 1 year was made appropriately.  She established care with me but appears to be scheduled with Dr. Ashley Royalty for her physical next year.  Also please verify that her husband's appointment was scheduled correctly.  Are they switching PCPs?  ___________________________________________ Thayer Ohm, DNP, APRN, FNP-BC [facility] [place]

## 2021-11-10 LAB — LIPID PANEL
Cholesterol: 203 mg/dL — ABNORMAL HIGH (ref <200)
HDL: 55 mg/dL (ref >=50)
LDL Cholesterol (Calc): 131 mg/dL (calc) — ABNORMAL HIGH
Non-HDL Cholesterol (Calc): 148 mg/dL (calc) — ABNORMAL HIGH (ref <130)
Total CHOL/HDL Ratio: 3.7 (calc) (ref <5.0)
Triglycerides: 77 mg/dL (ref <150)

## 2021-11-10 LAB — CBC WITH DIFFERENTIAL/PLATELET
Absolute Monocytes: 451 cells/uL (ref 200–950)
Basophils Absolute: 43 cells/uL (ref 0–200)
Basophils Relative: 0.7 %
Eosinophils Absolute: 183 cells/uL (ref 15–500)
Eosinophils Relative: 3 %
HCT: 43 % (ref 35.0–45.0)
Hemoglobin: 14.8 g/dL (ref 11.7–15.5)
Lymphs Abs: 2556 cells/uL (ref 850–3900)
MCH: 31 pg (ref 27.0–33.0)
MCHC: 34.4 g/dL (ref 32.0–36.0)
MCV: 90.1 fL (ref 80.0–100.0)
MPV: 10.3 fL (ref 7.5–12.5)
Monocytes Relative: 7.4 %
Neutro Abs: 2867 cells/uL (ref 1500–7800)
Neutrophils Relative %: 47 %
Platelets: 296 10*3/uL (ref 140–400)
RBC: 4.77 10*6/uL (ref 3.80–5.10)
RDW: 12.7 % (ref 11.0–15.0)
Total Lymphocyte: 41.9 %
WBC: 6.1 10*3/uL (ref 3.8–10.8)

## 2021-11-10 LAB — TSH: TSH: 1.9 mIU/L

## 2021-11-10 LAB — COMPLETE METABOLIC PANEL WITH GFR
AG Ratio: 1.3 (calc) (ref 1.0–2.5)
ALT: 9 U/L (ref 6–29)
AST: 15 U/L (ref 10–30)
Albumin: 3.8 g/dL (ref 3.6–5.1)
Alkaline phosphatase (APISO): 56 U/L (ref 31–125)
BUN: 9 mg/dL (ref 7–25)
CO2: 29 mmol/L (ref 20–32)
Calcium: 9.5 mg/dL (ref 8.6–10.2)
Chloride: 104 mmol/L (ref 98–110)
Creat: 0.72 mg/dL (ref 0.50–0.97)
Globulin: 2.9 g/dL (calc) (ref 1.9–3.7)
Glucose, Bld: 79 mg/dL (ref 65–99)
Potassium: 4.4 mmol/L (ref 3.5–5.3)
Sodium: 140 mmol/L (ref 135–146)
Total Bilirubin: 0.5 mg/dL (ref 0.2–1.2)
Total Protein: 6.7 g/dL (ref 6.1–8.1)
eGFR: 115 mL/min/{1.73_m2} (ref >=60)

## 2021-11-10 LAB — HEMOGLOBIN A1C
Hgb A1c MFr Bld: 4.7 % of total Hgb (ref <5.7)
Mean Plasma Glucose: 88 mg/dL
eAG (mmol/L): 4.9 mmol/L

## 2021-11-12 NOTE — Telephone Encounter (Signed)
My apologies that was an error on my behalf. I corrected it for both Amber Mack and Amber Mack.

## 2021-12-04 ENCOUNTER — Ambulatory Visit (INDEPENDENT_AMBULATORY_CARE_PROVIDER_SITE_OTHER): Payer: BC Managed Care – PPO

## 2021-12-04 ENCOUNTER — Other Ambulatory Visit: Payer: Self-pay

## 2021-12-04 ENCOUNTER — Ambulatory Visit (INDEPENDENT_AMBULATORY_CARE_PROVIDER_SITE_OTHER): Payer: BC Managed Care – PPO | Admitting: Sports Medicine

## 2021-12-04 ENCOUNTER — Encounter: Payer: Self-pay | Admitting: Sports Medicine

## 2021-12-04 DIAGNOSIS — M79671 Pain in right foot: Secondary | ICD-10-CM

## 2021-12-04 DIAGNOSIS — G8929 Other chronic pain: Secondary | ICD-10-CM | POA: Diagnosis not present

## 2021-12-04 MED ORDER — MELOXICAM 15 MG PO TABS
ORAL_TABLET | ORAL | 3 refills | Status: DC
Start: 2021-12-04 — End: 2021-12-05

## 2021-12-04 NOTE — Assessment & Plan Note (Signed)
This is a very pleasant 31 year old female, she has been doing a lot of running, unfortunately over the past year she has noted increasing pain at her dorsal midfoot when running on her forefoot/sprinting. Heel strike and does not reproduce the same pain. Unfortunately box jumping and jump roping also create the pain. On exam I am not able to reproduce the pain, she does have pes cavus. Leg lengths are even and hip abductor strength is normal. I think we can start conservatively, custom molded orthotics, x-rays, meloxicam, return to see me in a month, MRI if no better.

## 2021-12-04 NOTE — Progress Notes (Signed)
° ° °  Procedures performed today:    None.  Independent interpretation of notes and tests performed by another provider:   None.  Brief History, Exam, Impression, and Recommendations:    Chronic foot pain, right This is a very pleasant 31 year old female, she has been doing a lot of running, unfortunately over the past year she has noted increasing pain at her dorsal midfoot when running on her forefoot/sprinting. Heel strike and does not reproduce the same pain. Unfortunately box jumping and jump roping also create the pain. On exam I am not able to reproduce the pain, she does have pes cavus. Leg lengths are even and hip abductor strength is normal. I think we can start conservatively, custom molded orthotics, x-rays, meloxicam, return to see me in a month, MRI if no better.    ___________________________________________ Ihor Austin. Benjamin Stain, M.D., ABFM., CAQSM. Primary Care and Sports Medicine [facility]  Adjunct Instructor of Family Medicine  [employer]

## 2021-12-05 MED ORDER — MELOXICAM 15 MG PO TABS: ORAL_TABLET | ORAL | 3 refills | Status: DC

## 2021-12-11 ENCOUNTER — Encounter: Payer: Self-pay | Admitting: Family Medicine

## 2021-12-11 ENCOUNTER — Ambulatory Visit (INDEPENDENT_AMBULATORY_CARE_PROVIDER_SITE_OTHER): Payer: BC Managed Care – PPO | Admitting: Family Medicine

## 2021-12-11 DIAGNOSIS — G8929 Other chronic pain: Secondary | ICD-10-CM

## 2021-12-11 DIAGNOSIS — M79671 Pain in right foot: Secondary | ICD-10-CM

## 2021-12-11 NOTE — Progress Notes (Signed)
°  Amber Mack - 31 y.o. female MRN [id number]  Date of birth: [birth year]  SUBJECTIVE:  Including CC & ROS.  No chief complaint on file.   Amber Mack is a 31 y.o. female that is presenting with acute right foot pain.  The pain is occurring over the dorsal midfoot.  It is gotten worse since she has been working out more.  Denies any injury.   Review of Systems See HPI   HISTORY: Past Medical, Surgical, Social, and Family History Reviewed & Updated per EMR.   Pertinent Historical Findings include:  History reviewed. No pertinent past medical history.  History reviewed. No pertinent surgical history.   PHYSICAL EXAM:  VS: Ht 5\' 2"  (1.575 m)    Wt 163 lb (73.9 kg)    BMI 29.81 kg/m  Physical Exam Gen: NAD, alert, cooperative with exam, well-appearing MSK:  Neurovascularly intact    Patient was fitted for a standard, cushioned, semi-rigid orthotic. The orthotic was heated and afterward the patient stood on the orthotic blank positioned on the orthotic stand. The patient was positioned in subtalar neutral position and 10 degrees of ankle dorsiflexion in a weight bearing stance. After completion of molding, a stable base was applied to the orthotic blank. The blank was ground to a stable position for weight bearing. Size: 7w Pairs: 2 Base: Blue EVA Additional Posting and Padding:none The patient ambulated these, and they were very comfortable.     ASSESSMENT & PLAN:   Chronic foot pain, right Acute on chronic in nature.  No significant swelling on exam today.  Does tend to have more of a supinated gait. -Counseled on home exercise therapy and supportive care. -Orthotics today. -Could consider lateral posting's.

## 2021-12-11 NOTE — Assessment & Plan Note (Signed)
Acute on chronic in nature.  No significant swelling on exam today.  Does tend to have more of a supinated gait. -Counseled on home exercise therapy and supportive care. -Orthotics today. -Could consider lateral posting's.

## 2022-01-02 ENCOUNTER — Ambulatory Visit: Payer: BC Managed Care – PPO | Admitting: Sports Medicine

## 2022-01-09 ENCOUNTER — Ambulatory Visit (INDEPENDENT_AMBULATORY_CARE_PROVIDER_SITE_OTHER): Payer: BC Managed Care – PPO | Admitting: Sports Medicine

## 2022-01-09 DIAGNOSIS — M79671 Pain in right foot: Secondary | ICD-10-CM | POA: Diagnosis not present

## 2022-01-09 DIAGNOSIS — G8929 Other chronic pain: Secondary | ICD-10-CM

## 2022-01-09 NOTE — Assessment & Plan Note (Signed)
This is a pleasant 31 year old female, she was having some increasing dorsal midfoot pain, exam is for the most part unrevealing, we give her some meloxicam got some x-rays, and set her up with custom orthotics and her dorsal pain has resolved completely. ?Return as needed. ?Of note leg lengths and hip abductor strength was normal bilaterally. ?

## 2022-01-09 NOTE — Progress Notes (Signed)
? ? ?  Procedures performed today:   ? ?None. ? ?Independent interpretation of notes and tests performed by another provider:  ? ?None. ? ?Brief History, Exam, Impression, and Recommendations:   ? ?Chronic foot pain, right ?This is a pleasant 31 year old female, she was having some increasing dorsal midfoot pain, exam is for the most part unrevealing, we give her some meloxicam got some x-rays, and set her up with custom orthotics and her dorsal pain has resolved completely. ?Return as needed. ?Of note leg lengths and hip abductor strength was normal bilaterally. ? ? ? ?___________________________________________ ?Gwen Her. Dianah Field, M.D., ABFM., CAQSM. ?Primary Care and Sports Medicine ?[place] ? ?Adjunct Instructor of Family Medicine  ?[employer] ?

## 2022-01-30 ENCOUNTER — Encounter: Payer: Self-pay | Admitting: Medical-Surgical

## 2022-05-31 DIAGNOSIS — Z01419 Encounter for gynecological examination (general) (routine) without abnormal findings: Secondary | ICD-10-CM | POA: Diagnosis not present

## 2022-05-31 DIAGNOSIS — Z6831 Body mass index (BMI) 31.0-31.9, adult: Secondary | ICD-10-CM | POA: Diagnosis not present

## 2022-11-15 ENCOUNTER — Encounter: Payer: Self-pay | Admitting: Medical-Surgical

## 2022-11-15 ENCOUNTER — Ambulatory Visit (INDEPENDENT_AMBULATORY_CARE_PROVIDER_SITE_OTHER): Payer: BC Managed Care – PPO | Admitting: Medical-Surgical

## 2022-11-15 VITALS — BP 95/61 | HR 71 | Resp 20 | Ht 62.0 in | Wt 165.4 lb

## 2022-11-15 DIAGNOSIS — Z0000 Encounter for general adult medical examination without abnormal findings: Secondary | ICD-10-CM | POA: Diagnosis not present

## 2022-11-15 DIAGNOSIS — Z13220 Encounter for screening for lipoid disorders: Secondary | ICD-10-CM | POA: Diagnosis not present

## 2022-11-15 NOTE — Progress Notes (Signed)
Complete physical exam  Patient: Amber Mack   DOB: 1990/11/03   32 y.o. Female  MRN: [id number]  Subjective:    Chief Complaint  Patient presents with   Annual Exam    Amber Mack is a 32 y.o. female who presents today for a complete physical exam. She reports consuming a general diet.  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 Has stationary bike and elliptical at home.  She generally feels well. She reports sleeping well. She does not have additional problems to discuss today.   Most recent fall risk assessment:    11/15/2022    8:42 AM  Fall Risk   Falls in the past year? 0  Number falls in past yr: 0  Injury with Fall? 0  Risk for fall due to : No Fall Risks  Follow up Falls evaluation completed     Most recent depression screenings:    11/15/2022    8:42 AM 11/09/2021    8:43 AM  PHQ 2/9 Scores  PHQ - 2 Score 0 1    Vision:Within last year and Dental: No current dental problems and Receives regular dental care    Patient Care Team: Samuel Bouche, NP as PCP - General (Nurse Practitioner)   Outpatient Medications Prior to Visit  Medication Sig   levonorgestrel (KYLEENA) 19.5 MG IUD 1 each by Intrauterine route once.   Multiple Vitamin (MULTIVITAMIN) tablet Take 1 tablet by mouth daily.   [DISCONTINUED] meloxicam (MOBIC) 15 MG tablet One tab PO qAM with a meal for 2 weeks, then daily prn pain. (Patient not taking: Reported on 11/15/2022)   No facility-administered medications prior to visit.    Review of Systems  Constitutional:  Negative for chills, fever, malaise/fatigue and weight loss.  HENT:  Negative for congestion, ear pain, hearing loss, sinus pain and sore throat.   Eyes:  Negative for blurred vision, photophobia and pain.  Respiratory:  Negative for cough, shortness of breath and wheezing.   Cardiovascular:  Negative for chest pain, palpitations and leg swelling.  Gastrointestinal:  Negative for abdominal pain, constipation, diarrhea, heartburn,  nausea and vomiting.  Genitourinary:  Negative for dysuria, frequency and urgency.  Musculoskeletal:  Negative for falls and neck pain.  Skin:  Negative for itching and rash.  Neurological:  Negative for dizziness, weakness and headaches.  Endo/Heme/Allergies:  Negative for polydipsia. Does not bruise/bleed easily.  Psychiatric/Behavioral:  Negative for depression, substance abuse and suicidal ideas. The patient is not nervous/anxious and does not have insomnia.      Objective:    BP 95/61 (BP Location: Right Arm, Cuff Size: Normal)   Pulse 71   Resp 20   Ht 5\' 2"  (1.575 m)   Wt 165 lb 6.4 oz (75 kg)   SpO2 97%   BMI 30.25 kg/m    Physical Exam Constitutional:      General: She is not in acute distress.    Appearance: Normal appearance. She is not ill-appearing.  HENT:     Head: Normocephalic and atraumatic.     Right Ear: Tympanic membrane, ear canal and external ear normal. There is no impacted cerumen.     Left Ear: Tympanic membrane, ear canal and external ear normal. There is no impacted cerumen.     Nose: Nose normal. No congestion or rhinorrhea.     Mouth/Throat:     Mouth: Mucous membranes are moist.     Pharynx: No oropharyngeal exudate or posterior oropharyngeal erythema.  Eyes:  General: No scleral icterus.       Right eye: No discharge.        Left eye: No discharge.     Extraocular Movements: Extraocular movements intact.     Conjunctiva/sclera: Conjunctivae normal.     Pupils: Pupils are equal, round, and reactive to light.  Neck:     Thyroid: No thyromegaly.     Vascular: No JVD.     Trachea: Trachea normal.  Cardiovascular:     Rate and Rhythm: Normal rate and regular rhythm.     Pulses: Normal pulses.     Heart sounds: Normal heart sounds. No murmur heard.    No friction rub. No gallop.  Pulmonary:     Effort: Pulmonary effort is normal. No respiratory distress.     Breath sounds: Normal breath sounds. No wheezing.  Abdominal:     General: Bowel  sounds are normal. There is no distension.     Palpations: Abdomen is soft.     Tenderness: There is no abdominal tenderness. There is no guarding.  Musculoskeletal:        General: Normal range of motion.     Cervical back: Normal range of motion and neck supple.  Lymphadenopathy:     Cervical: No cervical adenopathy.  Skin:    General: Skin is warm and dry.  Neurological:     Mental Status: She is alert and oriented to person, place, and time.     Cranial Nerves: No cranial nerve deficit.  Psychiatric:        Mood and Affect: Mood normal.        Behavior: Behavior normal.        Thought Content: Thought content normal.        Judgment: Judgment normal.      No results found for any visits on 11/15/22.     Assessment & Plan:    Routine Health Maintenance and Physical Exam  Immunization History  Administered Date(s) Administered   Influenza,inj,Quad PF,6+ Mos 09/22/2018, 11/09/2019, 11/09/2020, 11/09/2021   PFIZER(Purple Top)SARS-COV-2 Vaccination 01/07/2020, 02/01/2020, 09/16/2020   Tdap 01/12/2018    Health Maintenance  Topic Date Due   COVID-19 Vaccine (4 - 2023-24 season) 12/01/2022 (Originally 06/14/2022)   INFLUENZA VACCINE  01/12/2023 (Originally 05/14/2022)   PAP SMEAR-Modifier  05/06/2023   DTaP/Tdap/Td (2 - Td or Tdap) 01/13/2028   HPV VACCINES  Aged Out   Hepatitis C Screening  Discontinued   HIV Screening  Discontinued    Discussed health benefits of physical activity, and encouraged her to engage in regular exercise appropriate for her age and condition.  1. Annual physical exam Checking labs as below.  Up-to-date on most preventative care but would recommend updating dental care soon.  Wellness information provided with AVS. - Lipid panel - COMPLETE METABOLIC PANEL WITH GFR - CBC with Differential/Platelet  2. Lipid screening Checking lipid panel today. - Lipid panel  Return in about 1 year (around 11/16/2023) for annual physical exam.   Samuel Bouche,  NP

## 2022-11-16 LAB — COMPLETE METABOLIC PANEL WITH GFR
AG Ratio: 1.3 (calc) (ref 1.0–2.5)
ALT: 8 U/L (ref 6–29)
AST: 14 U/L (ref 10–30)
Albumin: 3.9 g/dL (ref 3.6–5.1)
Alkaline phosphatase (APISO): 64 U/L (ref 31–125)
BUN: 9 mg/dL (ref 7–25)
CO2: 27 mmol/L (ref 20–32)
Calcium: 9.2 mg/dL (ref 8.6–10.2)
Chloride: 104 mmol/L (ref 98–110)
Creat: 0.65 mg/dL (ref 0.50–0.97)
Globulin: 3 g/dL (calc) (ref 1.9–3.7)
Glucose, Bld: 79 mg/dL (ref 65–99)
Potassium: 4 mmol/L (ref 3.5–5.3)
Sodium: 138 mmol/L (ref 135–146)
Total Bilirubin: 0.7 mg/dL (ref 0.2–1.2)
Total Protein: 6.9 g/dL (ref 6.1–8.1)
eGFR: 121 mL/min/{1.73_m2} (ref >=60)

## 2022-11-16 LAB — CBC WITH DIFFERENTIAL/PLATELET
Absolute Monocytes: 466 cells/uL (ref 200–950)
Basophils Absolute: 32 cells/uL (ref 0–200)
Basophils Relative: 0.5 %
Eosinophils Absolute: 202 cells/uL (ref 15–500)
Eosinophils Relative: 3.2 %
HCT: 43.7 % (ref 35.0–45.0)
Hemoglobin: 15.3 g/dL (ref 11.7–15.5)
Lymphs Abs: 2539 cells/uL (ref 850–3900)
MCH: 31.8 pg (ref 27.0–33.0)
MCHC: 35 g/dL (ref 32.0–36.0)
MCV: 90.9 fL (ref 80.0–100.0)
MPV: 10.6 fL (ref 7.5–12.5)
Monocytes Relative: 7.4 %
Neutro Abs: 3062 cells/uL (ref 1500–7800)
Neutrophils Relative %: 48.6 %
Platelets: 276 10*3/uL (ref 140–400)
RBC: 4.81 10*6/uL (ref 3.80–5.10)
RDW: 12.6 % (ref 11.0–15.0)
Total Lymphocyte: 40.3 %
WBC: 6.3 10*3/uL (ref 3.8–10.8)

## 2022-11-16 LAB — LIPID PANEL
Cholesterol: 207 mg/dL — ABNORMAL HIGH (ref <200)
HDL: 66 mg/dL (ref >=50)
LDL Cholesterol (Calc): 124 mg/dL (calc) — ABNORMAL HIGH
Non-HDL Cholesterol (Calc): 141 mg/dL (calc) — ABNORMAL HIGH (ref <130)
Total CHOL/HDL Ratio: 3.1 (calc) (ref <5.0)
Triglycerides: 78 mg/dL (ref <150)

## 2023-01-27 ENCOUNTER — Encounter: Payer: Self-pay | Admitting: *Deleted

## 2023-01-29 DIAGNOSIS — Z30432 Encounter for removal of intrauterine contraceptive device: Secondary | ICD-10-CM | POA: Diagnosis not present

## 2023-05-28 ENCOUNTER — Encounter: Payer: Self-pay | Admitting: Medical-Surgical

## 2023-06-11 DIAGNOSIS — N925 Other specified irregular menstruation: Secondary | ICD-10-CM | POA: Diagnosis not present

## 2023-06-11 DIAGNOSIS — Z3202 Encounter for pregnancy test, result negative: Secondary | ICD-10-CM | POA: Diagnosis not present

## 2023-09-29 DIAGNOSIS — L821 Other seborrheic keratosis: Secondary | ICD-10-CM | POA: Diagnosis not present

## 2023-09-29 DIAGNOSIS — D229 Melanocytic nevi, unspecified: Secondary | ICD-10-CM | POA: Diagnosis not present

## 2023-09-29 DIAGNOSIS — L814 Other melanin hyperpigmentation: Secondary | ICD-10-CM | POA: Diagnosis not present

## 2023-09-29 DIAGNOSIS — L918 Other hypertrophic disorders of the skin: Secondary | ICD-10-CM | POA: Diagnosis not present

## 2023-10-21 DIAGNOSIS — L03113 Cellulitis of right upper limb: Secondary | ICD-10-CM | POA: Diagnosis not present

## 2023-10-27 ENCOUNTER — Encounter: Payer: Self-pay | Admitting: Medical-Surgical

## 2023-11-18 ENCOUNTER — Encounter: Payer: BC Managed Care – PPO | Admitting: Medical-Surgical

## 2023-11-20 ENCOUNTER — Ambulatory Visit (INDEPENDENT_AMBULATORY_CARE_PROVIDER_SITE_OTHER): Payer: BC Managed Care – PPO | Admitting: Medical-Surgical

## 2023-11-20 VITALS — BP 106/71 | HR 60 | Resp 20 | Ht 62.0 in | Wt 167.9 lb

## 2023-11-20 DIAGNOSIS — Z8349 Family history of other endocrine, nutritional and metabolic diseases: Secondary | ICD-10-CM | POA: Diagnosis not present

## 2023-11-20 DIAGNOSIS — E785 Hyperlipidemia, unspecified: Secondary | ICD-10-CM

## 2023-11-20 DIAGNOSIS — Z0000 Encounter for general adult medical examination without abnormal findings: Secondary | ICD-10-CM

## 2023-11-20 DIAGNOSIS — Z23 Encounter for immunization: Secondary | ICD-10-CM | POA: Diagnosis not present

## 2023-11-20 NOTE — Patient Instructions (Signed)

## 2023-11-20 NOTE — Progress Notes (Signed)
 Complete physical exam  Patient: Amber Mack   DOB: 27-Aug-1991   33 y.o. Female  MRN: [id number]  Subjective:    Chief Complaint  Patient presents with   Annual Exam    Amber Mack is a 33 y.o. female who presents today for a complete physical exam. She reports consuming a general diet.  Does burn [facility] classes 4 times weekly.  She generally feels well. She reports sleeping well. She does not have additional problems to discuss today.    Most recent fall risk assessment:    11/15/2022    8:42 AM  Fall Risk   Falls in the past year? 0  Number falls in past yr: 0  Injury with Fall? 0  Risk for fall due to : No Fall Risks  Follow up Falls evaluation completed     Most recent depression screenings:    11/20/2023   10:06 AM 11/15/2022    8:42 AM  PHQ 2/9 Scores  PHQ - 2 Score 0 0    Vision:Within last year, Dental: No current dental problems and Receives regular dental care, and STD: The patient denies history of sexually transmitted disease.    Patient Care Team: Willo Mini, NP as PCP - General (Nurse Practitioner)   Outpatient Medications Prior to Visit  Medication Sig   Multiple Vitamin (MULTIVITAMIN) tablet Take 1 tablet by mouth daily.   [DISCONTINUED] levonorgestrel  (KYLEENA ) 19.5 MG IUD 1 each by Intrauterine route once.   No facility-administered medications prior to visit.    Review of Systems  Constitutional:  Negative for chills, fever, malaise/fatigue and weight loss.  HENT:  Negative for congestion, ear pain, hearing loss, sinus pain and sore throat.   Eyes:  Negative for blurred vision, photophobia and pain.  Respiratory:  Negative for cough, shortness of breath and wheezing.   Cardiovascular:  Negative for chest pain, palpitations and leg swelling.  Gastrointestinal:  Negative for abdominal pain, constipation, diarrhea, heartburn, nausea and vomiting.  Genitourinary:  Negative for dysuria, frequency and urgency.  Musculoskeletal:  Negative  for falls and neck pain.  Skin:  Negative for itching and rash.  Neurological:  Negative for dizziness, weakness and headaches.  Endo/Heme/Allergies:  Negative for polydipsia. Does not bruise/bleed easily.  Psychiatric/Behavioral:  Negative for depression, substance abuse and suicidal ideas. The patient is not nervous/anxious and does not have insomnia.      Objective:    BP 106/71 (BP Location: Left Arm, Cuff Size: Normal)   Pulse 60   Resp 20   Ht 5' 2 (1.575 m)   Wt 167 lb 14.4 oz (76.2 kg)   SpO2 99%   BMI 30.71 kg/m    Physical Exam Vitals reviewed.  Constitutional:      General: She is not in acute distress.    Appearance: Normal appearance. She is not ill-appearing.  HENT:     Head: Normocephalic and atraumatic.     Right Ear: Tympanic membrane, ear canal and external ear normal. There is no impacted cerumen.     Left Ear: Tympanic membrane, ear canal and external ear normal. There is no impacted cerumen.     Nose: Nose normal. No congestion or rhinorrhea.     Mouth/Throat:     Mouth: Mucous membranes are moist.     Pharynx: No oropharyngeal exudate or posterior oropharyngeal erythema.  Eyes:     General: No scleral icterus.       Right eye: No discharge.  Left eye: No discharge.     Extraocular Movements: Extraocular movements intact.     Conjunctiva/sclera: Conjunctivae normal.     Pupils: Pupils are equal, round, and reactive to light.  Neck:     Thyroid : No thyromegaly.     Vascular: No carotid bruit or JVD.     Trachea: Trachea normal.  Cardiovascular:     Rate and Rhythm: Normal rate and regular rhythm.     Pulses: Normal pulses.     Heart sounds: Normal heart sounds. No murmur heard.    No friction rub. No gallop.  Pulmonary:     Effort: Pulmonary effort is normal. No respiratory distress.     Breath sounds: Normal breath sounds. No wheezing.  Abdominal:     General: Bowel sounds are normal. There is no distension.     Palpations: Abdomen is  soft.     Tenderness: There is no abdominal tenderness. There is no guarding.  Musculoskeletal:        General: Normal range of motion.     Cervical back: Normal range of motion and neck supple.  Lymphadenopathy:     Cervical: No cervical adenopathy.  Skin:    General: Skin is warm and dry.  Neurological:     Mental Status: She is alert and oriented to person, place, and time.     Cranial Nerves: No cranial nerve deficit.  Psychiatric:        Mood and Affect: Mood normal.        Behavior: Behavior normal.        Thought Content: Thought content normal.        Judgment: Judgment normal.      No results found for any visits on 11/20/23.     Assessment & Plan:    Routine Health Maintenance and Physical Exam  Immunization History  Administered Date(s) Administered   Influenza,inj,Quad PF,6+ Mos 09/22/2018, 11/09/2019, 11/09/2020, 11/09/2021   PFIZER(Purple Top)SARS-COV-2 Vaccination 01/07/2020, 02/01/2020, 09/16/2020   Tdap 02/08/2017, 01/12/2018    Health Maintenance  Topic Date Due   Cervical Cancer Screening (HPV/Pap Cotest)  08/02/2021   INFLUENZA VACCINE  05/15/2023   COVID-19 Vaccine (4 - 2024-25 season) 12/06/2023 (Originally 06/15/2023)   DTaP/Tdap/Td (3 - Td or Tdap) 01/13/2028   HPV VACCINES  Aged Out   Hepatitis C Screening  Discontinued   HIV Screening  Discontinued    Discussed health benefits of physical activity, and encouraged her to engage in regular exercise appropriate for her age and condition.  1. Annual physical exam (Primary) Deferring labs today per patient preference.  Up-to-date on preventative care.  Wellness information provided with AVS.  2. Dyslipidemia As noted above, deferring labs.  3. Family history of thyroid  disease in sister No concerning symptoms.  Deferring labs.  4. Need for influenza vaccination Flu shot given in office today. - Flu vaccine trivalent PF, 6mos and older(Flulaval,Afluria,Fluarix,Fluzone)  Return in about 1  year (around 11/19/2024) for annual physical exam.   Zada Palin, NP

## 2024-01-18 IMAGING — DX DG FOOT COMPLETE 3+V*R*
3 series · 3 of 3 positions shown · non-contrast
Comparison: None.

CLINICAL DATA: Pain and dorsal midfoot.  No known injury.

EXAM:
RIGHT FOOT COMPLETE - 3+ VIEW

[foot ap]
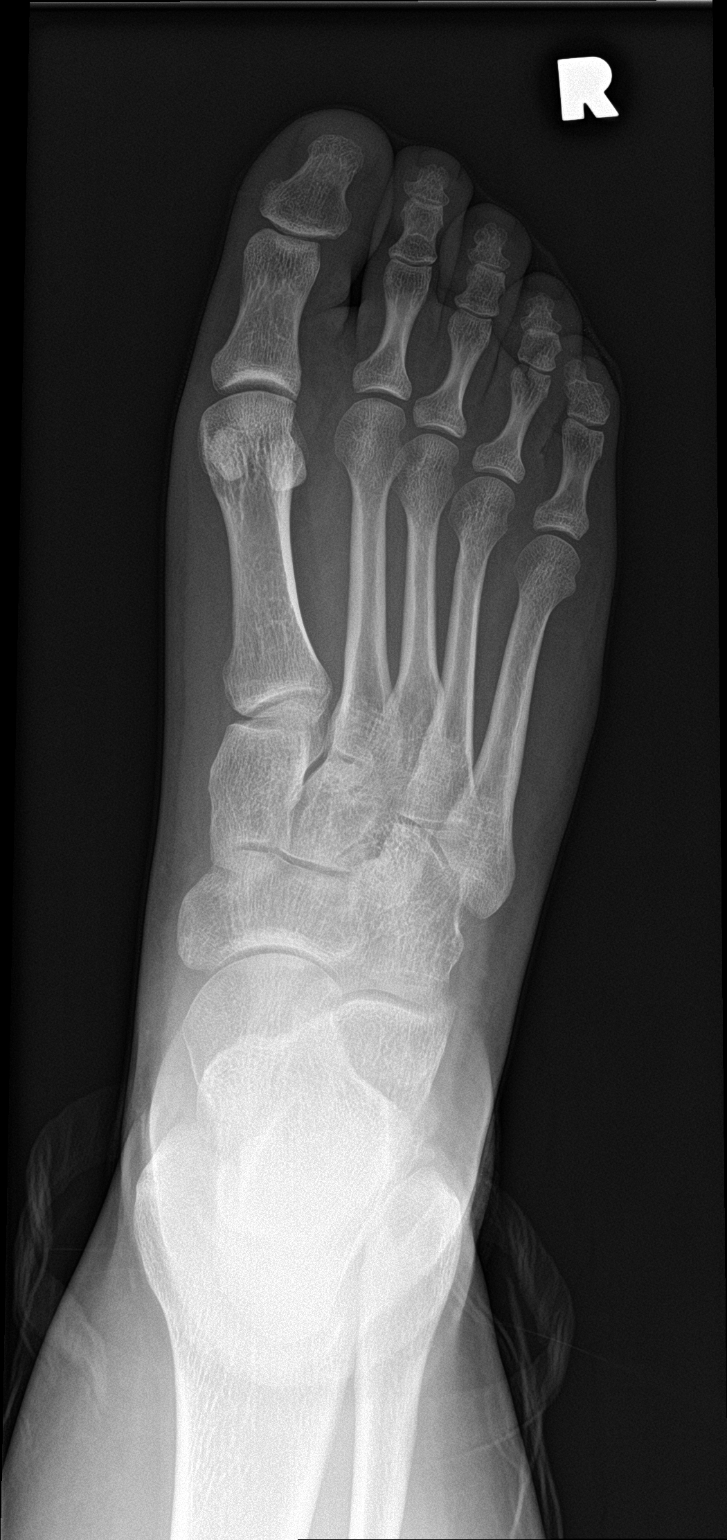

[foot obl]
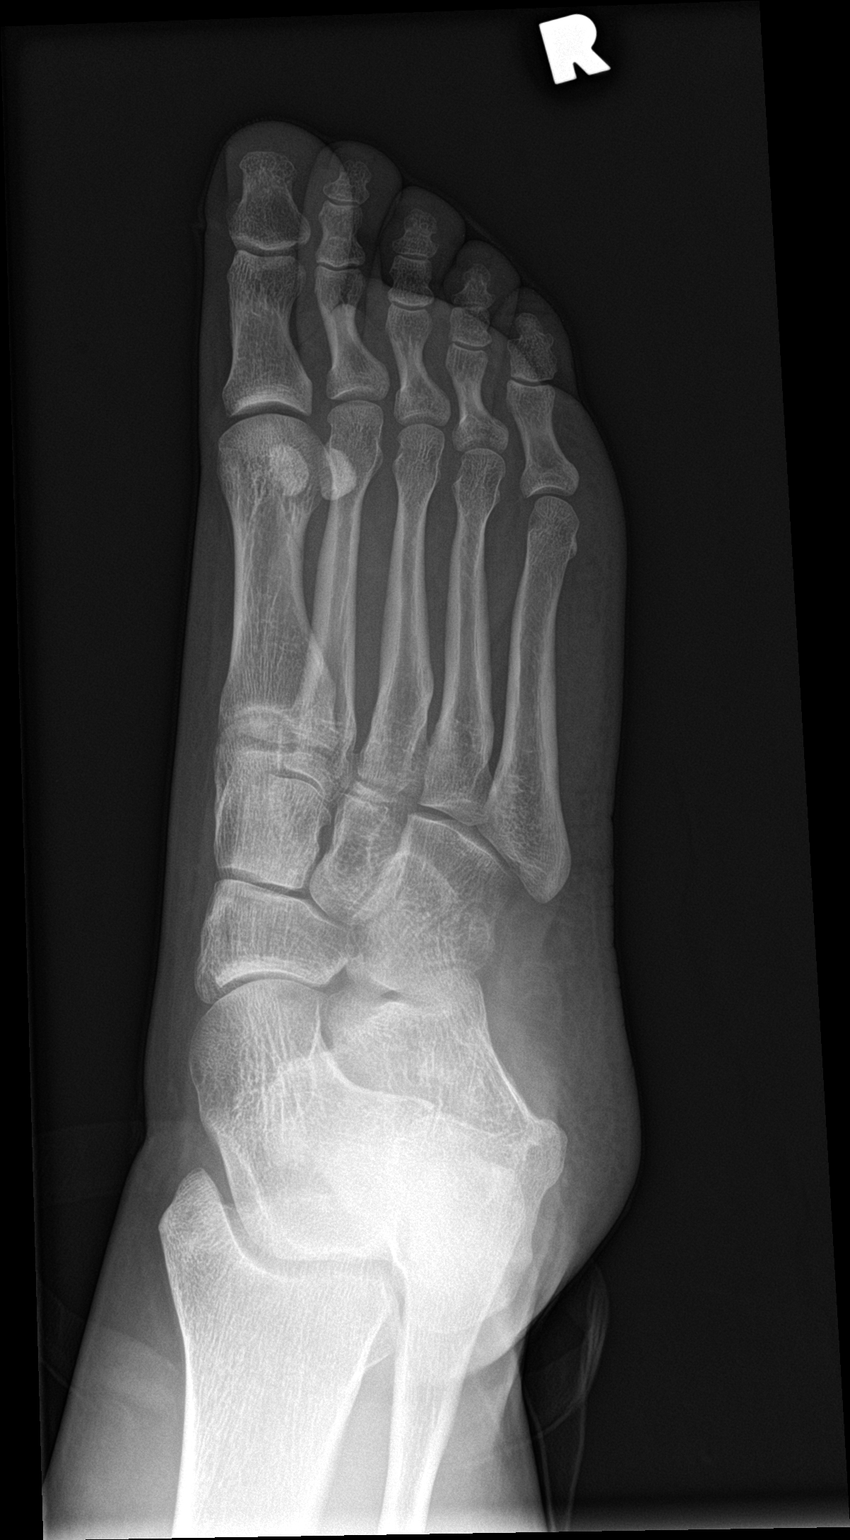

[foot lat]
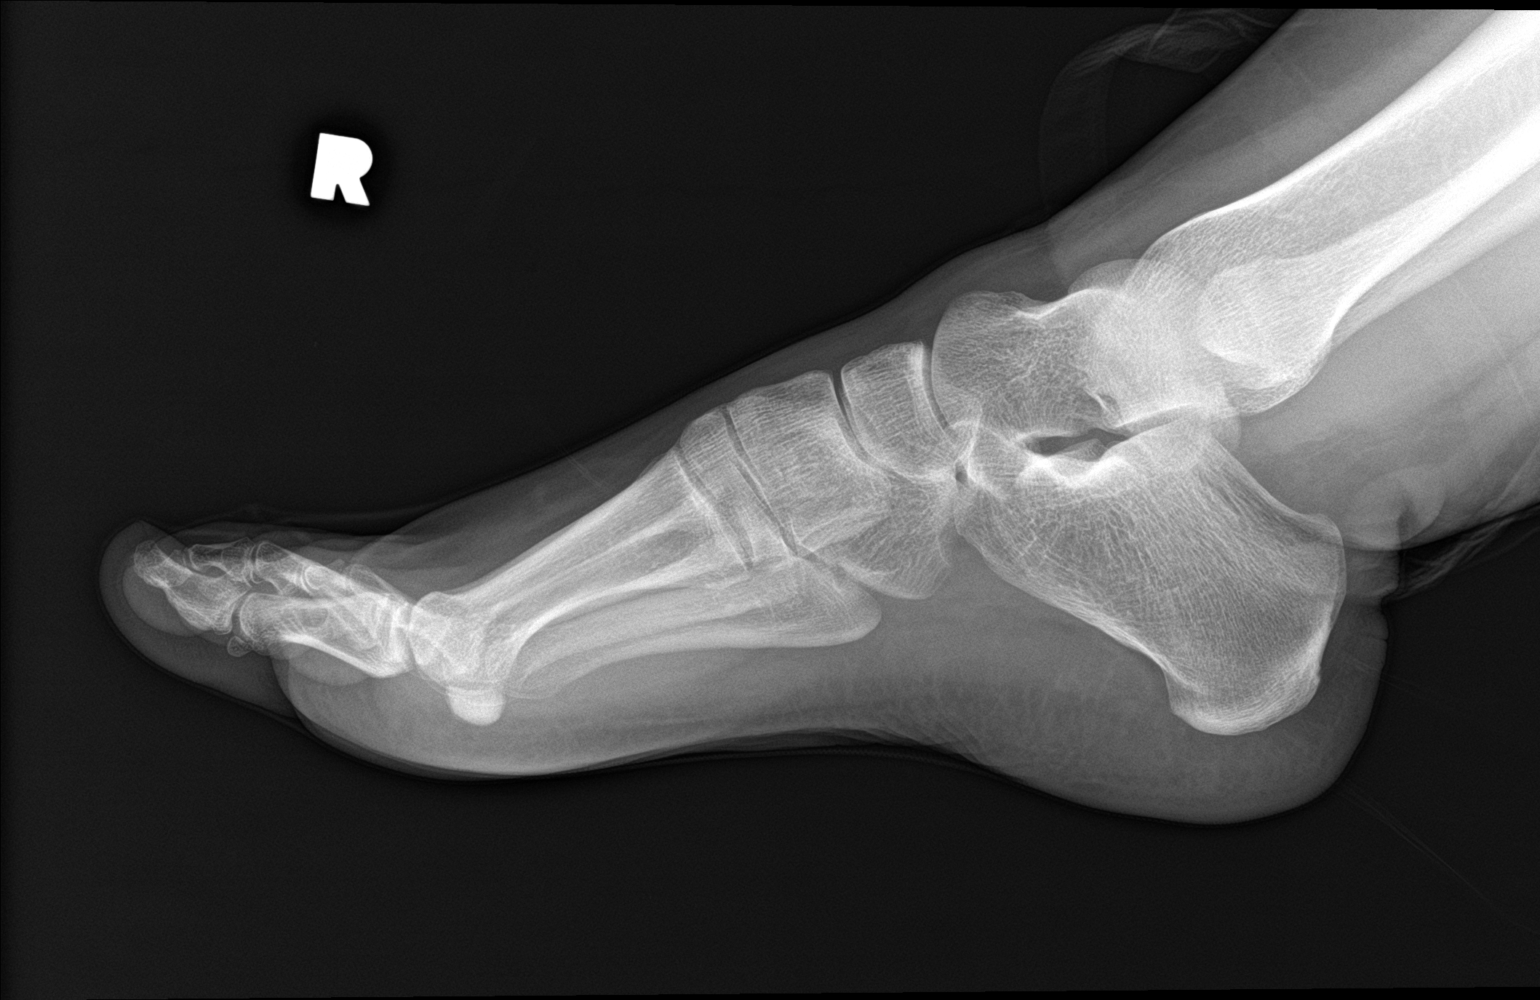

[3 of 3 positions shown; findings below may reference images not displayed]

FINDINGS: There is no evidence of fracture or dislocation. There is no
evidence of arthropathy or other focal bone abnormality. Soft
tissues are unremarkable.
IMPRESSION: Negative.

## 2024-02-09 DIAGNOSIS — Z3401 Encounter for supervision of normal first pregnancy, first trimester: Secondary | ICD-10-CM | POA: Diagnosis not present

## 2024-02-09 DIAGNOSIS — Z3687 Encounter for antenatal screening for uncertain dates: Secondary | ICD-10-CM | POA: Diagnosis not present

## 2024-02-09 DIAGNOSIS — Z114 Encounter for screening for human immunodeficiency virus [HIV]: Secondary | ICD-10-CM | POA: Diagnosis not present

## 2024-02-09 DIAGNOSIS — O99211 Obesity complicating pregnancy, first trimester: Secondary | ICD-10-CM | POA: Diagnosis not present

## 2024-03-09 DIAGNOSIS — Z3401 Encounter for supervision of normal first pregnancy, first trimester: Secondary | ICD-10-CM | POA: Diagnosis not present

## 2024-03-09 DIAGNOSIS — Z36 Encounter for antenatal screening for chromosomal anomalies: Secondary | ICD-10-CM | POA: Diagnosis not present

## 2024-03-09 DIAGNOSIS — Z3182 Encounter for Rh incompatibility status: Secondary | ICD-10-CM | POA: Diagnosis not present

## 2024-03-09 DIAGNOSIS — Z113 Encounter for screening for infections with a predominantly sexual mode of transmission: Secondary | ICD-10-CM | POA: Diagnosis not present

## 2024-04-07 DIAGNOSIS — Z363 Encounter for antenatal screening for malformations: Secondary | ICD-10-CM | POA: Diagnosis not present

## 2024-05-04 DIAGNOSIS — Z363 Encounter for antenatal screening for malformations: Secondary | ICD-10-CM | POA: Diagnosis not present

## 2024-06-01 DIAGNOSIS — O99212 Obesity complicating pregnancy, second trimester: Secondary | ICD-10-CM | POA: Diagnosis not present

## 2024-06-01 DIAGNOSIS — O358XX0 Maternal care for other (suspected) fetal abnormality and damage, not applicable or unspecified: Secondary | ICD-10-CM | POA: Diagnosis not present

## 2024-06-15 ENCOUNTER — Encounter: Payer: Self-pay | Admitting: Sports Medicine

## 2024-06-30 DIAGNOSIS — Z1332 Encounter for screening for maternal depression: Secondary | ICD-10-CM | POA: Diagnosis not present

## 2024-06-30 DIAGNOSIS — Z23 Encounter for immunization: Secondary | ICD-10-CM | POA: Diagnosis not present

## 2024-06-30 DIAGNOSIS — Z3483 Encounter for supervision of other normal pregnancy, third trimester: Secondary | ICD-10-CM | POA: Diagnosis not present

## 2024-09-01 DIAGNOSIS — Z113 Encounter for screening for infections with a predominantly sexual mode of transmission: Secondary | ICD-10-CM | POA: Diagnosis not present

## 2024-09-01 DIAGNOSIS — Z3483 Encounter for supervision of other normal pregnancy, third trimester: Secondary | ICD-10-CM | POA: Diagnosis not present

## 2024-09-01 DIAGNOSIS — Z3685 Encounter for antenatal screening for Streptococcus B: Secondary | ICD-10-CM | POA: Diagnosis not present

## 2024-09-21 DIAGNOSIS — Z3A39 39 weeks gestation of pregnancy: Secondary | ICD-10-CM | POA: Diagnosis not present

## 2024-09-21 DIAGNOSIS — Z7982 Long term (current) use of aspirin: Secondary | ICD-10-CM | POA: Diagnosis not present

## 2024-09-21 DIAGNOSIS — Z3403 Encounter for supervision of normal first pregnancy, third trimester: Secondary | ICD-10-CM | POA: Diagnosis not present

## 2024-09-21 DIAGNOSIS — O99214 Obesity complicating childbirth: Secondary | ICD-10-CM | POA: Diagnosis not present

## 2024-09-21 DIAGNOSIS — H9190 Unspecified hearing loss, unspecified ear: Secondary | ICD-10-CM | POA: Diagnosis not present

## 2024-11-22 ENCOUNTER — Encounter: Payer: BC Managed Care – PPO | Admitting: Medical-Surgical
